# Patient Record
Sex: Female | Born: 1945 | Race: White | Hispanic: No | Marital: Single | Smoking: Former smoker
Health system: Southern US, Community
[De-identification: ages and names within clinical notes are randomized; demographics above are authoritative.]

## PROBLEM LIST (undated history)

## (undated) DIAGNOSIS — M810 Age-related osteoporosis without current pathological fracture: Secondary | ICD-10-CM

## (undated) DIAGNOSIS — F419 Anxiety disorder, unspecified: Secondary | ICD-10-CM

## (undated) DIAGNOSIS — J45909 Unspecified asthma, uncomplicated: Secondary | ICD-10-CM

## (undated) DIAGNOSIS — G709 Myoneural disorder, unspecified: Secondary | ICD-10-CM

## (undated) DIAGNOSIS — M1990 Unspecified osteoarthritis, unspecified site: Secondary | ICD-10-CM

## (undated) DIAGNOSIS — IMO0002 Reserved for concepts with insufficient information to code with codable children: Secondary | ICD-10-CM

## (undated) DIAGNOSIS — K219 Gastro-esophageal reflux disease without esophagitis: Secondary | ICD-10-CM

## (undated) DIAGNOSIS — I639 Cerebral infarction, unspecified: Secondary | ICD-10-CM

## (undated) DIAGNOSIS — I10 Essential (primary) hypertension: Secondary | ICD-10-CM

## (undated) DIAGNOSIS — J449 Chronic obstructive pulmonary disease, unspecified: Secondary | ICD-10-CM

## (undated) DIAGNOSIS — Z5189 Encounter for other specified aftercare: Secondary | ICD-10-CM

## (undated) HISTORY — PX: ABDOMINAL HYSTERECTOMY: SHX81

## (undated) HISTORY — DX: Gastro-esophageal reflux disease without esophagitis: K21.9

## (undated) HISTORY — DX: Chronic obstructive pulmonary disease, unspecified: J44.9

## (undated) HISTORY — DX: Age-related osteoporosis without current pathological fracture: M81.0

## (undated) HISTORY — DX: Encounter for other specified aftercare: Z51.89

## (undated) HISTORY — DX: Reserved for concepts with insufficient information to code with codable children: IMO0002

## (undated) HISTORY — DX: Cerebral infarction, unspecified: I63.9

## (undated) HISTORY — DX: Myoneural disorder, unspecified: G70.9

## (undated) HISTORY — PX: FRACTURE SURGERY: SHX138

## (undated) HISTORY — DX: Anxiety disorder, unspecified: F41.9

## (undated) HISTORY — DX: Unspecified asthma, uncomplicated: J45.909

## (undated) HISTORY — PX: TUBAL LIGATION: SHX77

## (undated) HISTORY — DX: Unspecified osteoarthritis, unspecified site: M19.90

## (undated) HISTORY — PX: EYE SURGERY: SHX253

---

## 2003-06-08 ENCOUNTER — Ambulatory Visit (HOSPITAL_COMMUNITY): Admission: RE | Admit: 2003-06-08 | Discharge: 2003-06-08 | Payer: Self-pay | Admitting: Internal Medicine

## 2004-04-17 ENCOUNTER — Ambulatory Visit: Payer: Self-pay | Admitting: Cardiology

## 2011-05-26 DIAGNOSIS — R011 Cardiac murmur, unspecified: Secondary | ICD-10-CM

## 2013-08-03 DIAGNOSIS — I10 Essential (primary) hypertension: Secondary | ICD-10-CM | POA: Insufficient documentation

## 2013-08-24 DIAGNOSIS — M5430 Sciatica, unspecified side: Secondary | ICD-10-CM | POA: Insufficient documentation

## 2013-08-24 DIAGNOSIS — M545 Low back pain, unspecified: Secondary | ICD-10-CM | POA: Insufficient documentation

## 2013-12-12 ENCOUNTER — Encounter (INDEPENDENT_AMBULATORY_CARE_PROVIDER_SITE_OTHER): Payer: Self-pay | Admitting: *Deleted

## 2014-03-29 DIAGNOSIS — H6990 Unspecified Eustachian tube disorder, unspecified ear: Secondary | ICD-10-CM | POA: Insufficient documentation

## 2014-05-18 ENCOUNTER — Ambulatory Visit (INDEPENDENT_AMBULATORY_CARE_PROVIDER_SITE_OTHER): Payer: Medicare PPO | Admitting: Otolaryngology

## 2014-05-18 DIAGNOSIS — H6983 Other specified disorders of Eustachian tube, bilateral: Secondary | ICD-10-CM

## 2014-08-16 DIAGNOSIS — H2513 Age-related nuclear cataract, bilateral: Secondary | ICD-10-CM | POA: Diagnosis not present

## 2014-08-16 DIAGNOSIS — E119 Type 2 diabetes mellitus without complications: Secondary | ICD-10-CM | POA: Diagnosis not present

## 2015-01-15 DIAGNOSIS — E7800 Pure hypercholesterolemia, unspecified: Secondary | ICD-10-CM | POA: Diagnosis not present

## 2015-01-15 DIAGNOSIS — E13319 Other specified diabetes mellitus with unspecified diabetic retinopathy without macular edema: Secondary | ICD-10-CM | POA: Diagnosis not present

## 2015-01-15 DIAGNOSIS — E161 Other hypoglycemia: Secondary | ICD-10-CM | POA: Diagnosis not present

## 2015-01-15 DIAGNOSIS — I10 Essential (primary) hypertension: Secondary | ICD-10-CM | POA: Diagnosis not present

## 2015-01-22 DIAGNOSIS — F064 Anxiety disorder due to known physiological condition: Secondary | ICD-10-CM | POA: Diagnosis not present

## 2015-01-22 DIAGNOSIS — I10 Essential (primary) hypertension: Secondary | ICD-10-CM | POA: Diagnosis not present

## 2015-01-22 DIAGNOSIS — Z23 Encounter for immunization: Secondary | ICD-10-CM | POA: Diagnosis not present

## 2015-01-22 DIAGNOSIS — M5431 Sciatica, right side: Secondary | ICD-10-CM | POA: Diagnosis not present

## 2015-01-22 DIAGNOSIS — Z13220 Encounter for screening for lipoid disorders: Secondary | ICD-10-CM | POA: Diagnosis not present

## 2015-01-22 DIAGNOSIS — R7301 Impaired fasting glucose: Secondary | ICD-10-CM | POA: Diagnosis not present

## 2015-03-30 DIAGNOSIS — J309 Allergic rhinitis, unspecified: Secondary | ICD-10-CM | POA: Insufficient documentation

## 2015-11-07 DIAGNOSIS — R7303 Prediabetes: Secondary | ICD-10-CM | POA: Insufficient documentation

## 2015-11-12 DIAGNOSIS — L80 Vitiligo: Secondary | ICD-10-CM | POA: Insufficient documentation

## 2015-11-12 DIAGNOSIS — E782 Mixed hyperlipidemia: Secondary | ICD-10-CM | POA: Insufficient documentation

## 2015-11-12 HISTORY — DX: Mixed hyperlipidemia: E78.2

## 2015-11-14 ENCOUNTER — Encounter (INDEPENDENT_AMBULATORY_CARE_PROVIDER_SITE_OTHER): Payer: Self-pay | Admitting: *Deleted

## 2016-08-11 ENCOUNTER — Encounter (INDEPENDENT_AMBULATORY_CARE_PROVIDER_SITE_OTHER): Payer: Self-pay | Admitting: *Deleted

## 2016-10-02 ENCOUNTER — Encounter (INDEPENDENT_AMBULATORY_CARE_PROVIDER_SITE_OTHER): Payer: Self-pay | Admitting: *Deleted

## 2016-10-02 ENCOUNTER — Encounter (INDEPENDENT_AMBULATORY_CARE_PROVIDER_SITE_OTHER): Payer: Self-pay

## 2016-10-03 ENCOUNTER — Other Ambulatory Visit (INDEPENDENT_AMBULATORY_CARE_PROVIDER_SITE_OTHER): Payer: Self-pay | Admitting: *Deleted

## 2016-10-03 DIAGNOSIS — Z1211 Encounter for screening for malignant neoplasm of colon: Secondary | ICD-10-CM

## 2016-10-03 DIAGNOSIS — Z86010 Personal history of colonic polyps: Secondary | ICD-10-CM

## 2017-01-16 ENCOUNTER — Encounter (INDEPENDENT_AMBULATORY_CARE_PROVIDER_SITE_OTHER): Payer: Self-pay | Admitting: *Deleted

## 2017-01-16 ENCOUNTER — Telehealth (INDEPENDENT_AMBULATORY_CARE_PROVIDER_SITE_OTHER): Payer: Self-pay | Admitting: *Deleted

## 2017-01-16 NOTE — Telephone Encounter (Signed)
Patient needs trilyte 

## 2017-01-19 MED ORDER — PEG 3350-KCL-NA BICARB-NACL 420 G PO SOLR: 4000.0000 mL | Freq: Once | ORAL | 0 refills | Status: AC

## 2017-01-20 ENCOUNTER — Telehealth (INDEPENDENT_AMBULATORY_CARE_PROVIDER_SITE_OTHER): Payer: Self-pay | Admitting: *Deleted

## 2017-01-20 NOTE — Telephone Encounter (Signed)
Referring MD/PCP: sasser   Procedure: tcs  Reason/Indication:  Screening, hx polyps  Has patient had this procedure before?  Yes, 2012 normal (scanned) -- TCS prior to 2012 had polyp  If so, when, by whom and where?    Is there a family history of colon cancer?  no  Who?  What age when diagnosed?    Is patient diabetic?   no      Does patient have prosthetic heart valve or mechanical valve?  no  Do you have a pacemaker?  no  Has patient ever had endocarditis? no  Has patient had joint replacement within last 12 months?  no  Is patient constipated or take laxatives? no  Does patient have a history of alcohol/drug use?  no  Is patient on Coumadin, Plavix and/or Aspirin? yes  Medications: asa 81 mg daily, lipitor 10 mg 1/2 tab every other day, hctz 12.5 mg daily, norvasc 10 mg daily  Allergies: nkda  Medication Adjustment per Dr Karilyn Cotaehman: asa 2 days  Procedure date & time: 02/19/17 at 830

## 2017-01-21 NOTE — Telephone Encounter (Signed)
agree

## 2017-02-19 ENCOUNTER — Encounter (HOSPITAL_COMMUNITY): Payer: Self-pay | Admitting: *Deleted

## 2017-02-19 ENCOUNTER — Encounter (HOSPITAL_COMMUNITY)
Admission: RE | Disposition: A | Discharge status: Home / Self Care | Payer: Self-pay | Source: Ambulatory Visit | Attending: Internal Medicine

## 2017-02-19 ENCOUNTER — Ambulatory Visit (HOSPITAL_COMMUNITY)
Admission: RE | Admit: 2017-02-19 | Discharge: 2017-02-19 | Disposition: A | Discharge status: Home / Self Care | Payer: Medicare Other | Source: Ambulatory Visit | Attending: Internal Medicine | Admitting: Internal Medicine

## 2017-02-19 DIAGNOSIS — K5730 Diverticulosis of large intestine without perforation or abscess without bleeding: Secondary | ICD-10-CM | POA: Insufficient documentation

## 2017-02-19 DIAGNOSIS — Z1211 Encounter for screening for malignant neoplasm of colon: Secondary | ICD-10-CM | POA: Insufficient documentation

## 2017-02-19 DIAGNOSIS — Q438 Other specified congenital malformations of intestine: Secondary | ICD-10-CM | POA: Diagnosis not present

## 2017-02-19 DIAGNOSIS — E785 Hyperlipidemia, unspecified: Secondary | ICD-10-CM | POA: Diagnosis not present

## 2017-02-19 DIAGNOSIS — Z7982 Long term (current) use of aspirin: Secondary | ICD-10-CM | POA: Diagnosis not present

## 2017-02-19 DIAGNOSIS — Z860100 Personal history of colon polyps, unspecified: Secondary | ICD-10-CM

## 2017-02-19 DIAGNOSIS — I10 Essential (primary) hypertension: Secondary | ICD-10-CM | POA: Insufficient documentation

## 2017-02-19 DIAGNOSIS — Z79899 Other long term (current) drug therapy: Secondary | ICD-10-CM | POA: Insufficient documentation

## 2017-02-19 DIAGNOSIS — Z09 Encounter for follow-up examination after completed treatment for conditions other than malignant neoplasm: Secondary | ICD-10-CM | POA: Diagnosis not present

## 2017-02-19 HISTORY — PX: COLONOSCOPY: SHX5424

## 2017-02-19 HISTORY — DX: Essential (primary) hypertension: I10

## 2017-02-19 SURGERY — COLONOSCOPY
Anesthesia: Moderate Sedation

## 2017-02-19 MED ORDER — STERILE WATER FOR IRRIGATION IR SOLN
Status: DC | PRN
  Administered 2017-02-19: 09:00:00

## 2017-02-19 MED ORDER — MIDAZOLAM HCL 5 MG/5ML IJ SOLN
INTRAMUSCULAR | Status: DC | PRN
  Administered 2017-02-19: 2 mg via INTRAVENOUS
  Administered 2017-02-19: 1 mg via INTRAVENOUS
  Administered 2017-02-19: 2 mg via INTRAVENOUS

## 2017-02-19 MED ORDER — MEPERIDINE HCL 50 MG/ML IJ SOLN
INTRAMUSCULAR | Status: AC
  Filled 2017-02-19: qty 1

## 2017-02-19 MED ORDER — SODIUM CHLORIDE 0.9 % IV SOLN
INTRAVENOUS | Status: DC
  Administered 2017-02-19: 08:00:00 via INTRAVENOUS

## 2017-02-19 MED ORDER — MEPERIDINE HCL 50 MG/ML IJ SOLN
INTRAMUSCULAR | Status: DC | PRN
  Administered 2017-02-19 (×2): 25 mg via INTRAVENOUS

## 2017-02-19 MED ORDER — MIDAZOLAM HCL 5 MG/5ML IJ SOLN
INTRAMUSCULAR | Status: AC
  Filled 2017-02-19: qty 10

## 2017-02-19 NOTE — Discharge Instructions (Signed)
Resume usual medications including aspirin as before. High fiber diet. No driving for 24 hours. Next colonoscopy in 7 years.   Colonoscopy, Adult, Care After This sheet gives you information about how to care for yourself after your procedure. Your doctor may also give you more specific instructions. If you have problems or questions, call your doctor. Follow these instructions at home: General instructions   For the first 24 hours after the procedure: ? Do not drive or use machinery. ? Do not sign important documents. ? Do not drink alcohol. ? Do your daily activities more slowly than normal. ? Eat foods that are soft and easy to digest. ? Rest often.  Take over-the-counter or prescription medicines only as told by your doctor.  It is up to you to get the results of your procedure. Ask your doctor, or the department performing the procedure, when your results will be ready. To help cramping and bloating:  Try walking around.  Put heat on your belly (abdomen) as told by your doctor. Use a heat source that your doctor recommends, such as a moist heat pack or a heating pad. ? Put a towel between your skin and the heat source. ? Leave the heat on for 20-30 minutes. ? Remove the heat if your skin turns bright red. This is especially important if you cannot feel pain, heat, or cold. You can get burned. Eating and drinking  Drink enough fluid to keep your pee (urine) clear or pale yellow.  Return to your normal diet as told by your doctor. Avoid heavy or fried foods that are hard to digest.  Avoid drinking alcohol for as long as told by your doctor. Contact a doctor if:  You have blood in your poop (stool) 2-3 days after the procedure. Get help right away if:  You have more than a small amount of blood in your poop.  You see large clumps of tissue (blood clots) in your poop.  Your belly is swollen.  You feel sick to your stomach (nauseous).  You throw up (vomit).  You  have a fever.  You have belly pain that gets worse, and medicine does not help your pain. This information is not intended to replace advice given to you by your health care provider. Make sure you discuss any questions you have with your health care provider.    High-Fiber Diet Fiber, also called dietary fiber, is a type of carbohydrate found in fruits, vegetables, whole grains, and beans. A high-fiber diet can have many health benefits. Your health care provider may recommend a high-fiber diet to help:  Prevent constipation. Fiber can make your bowel movements more regular.  Lower your cholesterol.  Relieve hemorrhoids, uncomplicated diverticulosis, or irritable bowel syndrome.  Prevent overeating as part of a weight-loss plan.  Prevent heart disease, type 2 diabetes, and certain cancers.  What is my plan? The recommended daily intake of fiber includes:  38 grams for men under age 69.  30 grams for men over age 69.  25 grams for women under age 69.  21 grams for women over age 69.  You can get the recommended daily intake of dietary fiber by eating a variety of fruits, vegetables, grains, and beans. Your health care provider may also recommend a fiber supplement if it is not possible to get enough fiber through your diet. What do I need to know about a high-fiber diet?  Fiber supplements have not been widely studied for their effectiveness, so it is better  to get fiber through food sources.  Always check the fiber content on thenutrition facts label of any prepackaged food. Look for foods that contain at least 5 grams of fiber per serving.  Ask your dietitian if you have questions about specific foods that are related to your condition, especially if those foods are not listed in the following section.  Increase your daily fiber consumption gradually. Increasing your intake of dietary fiber too quickly may cause bloating, cramping, or gas.  Drink plenty of water. Water  helps you to digest fiber. What foods can I eat? Grains Whole-grain breads. Multigrain cereal. Oats and oatmeal. Brown rice. Barley. Bulgur wheat. Millet. Bran muffins. Popcorn. Rye wafer crackers. Vegetables Sweet potatoes. Spinach. Kale. Artichokes. Cabbage. Broccoli. Green peas. Carrots. Squash. Fruits Berries. Pears. Apples. Oranges. Avocados. Prunes and raisins. Dried figs. Meats and Other Protein Sources Navy, kidney, pinto, and soy beans. Split peas. Lentils. Nuts and seeds. Dairy Fiber-fortified yogurt. Beverages Fiber-fortified soy milk. Fiber-fortified orange juice. Other Fiber bars. The items listed above may not be a complete list of recommended foods or beverages. Contact your dietitian for more options. What foods are not recommended? Grains White bread. Pasta made with refined flour. White rice. Vegetables Fried potatoes. Canned vegetables. Well-cooked vegetables. Fruits Fruit juice. Cooked, strained fruit. Meats and Other Protein Sources Fatty cuts of meat. Fried Environmental education officerpoultry or fried fish. Dairy Milk. Yogurt. Cream cheese. Sour cream. Beverages Soft drinks. Other Cakes and pastries. Butter and oils. The items listed above may not be a complete list of foods and beverages to avoid. Contact your dietitian for more information. What are some tips for including high-fiber foods in my diet?  Eat a wide variety of high-fiber foods.  Make sure that half of all grains consumed each day are whole grains.  Replace breads and cereals made from refined flour or white flour with whole-grain breads and cereals.  Replace white rice with brown rice, bulgur wheat, or millet.  Start the day with a breakfast that is high in fiber, such as a cereal that contains at least 5 grams of fiber per serving.  Use beans in place of meat in soups, salads, or pasta.  Eat high-fiber snacks, such as berries, raw vegetables, nuts, or popcorn. This information is not intended to replace  advice given to you by your health care provider. Make sure you discuss any questions you have with your health care provider. Document Released: 02/24/2005 Document Revised: 08/02/2015 Document Reviewed: 08/09/2013 Elsevier Interactive Patient Education  2017 [employer]

## 2017-02-19 NOTE — Op Note (Signed)
[facility] Patient Name: Robin Bridges Procedure Date: 02/19/2017 8:09 AM MRN: [id number] Date of Birth: 1945/04/12 Attending MD: Lionel December , MD CSN: [id number] Age: 71 Admit Type: Outpatient Procedure:                Colonoscopy Indications:              High risk colon cancer surveillance: Personal                            history of colonic polyps Providers:                Lionel December, MD, Loma Messing B. Patsy Lager, RN, Edythe Clarity, Technician, Burke Keels, Technician Referring MD:             Estanislado Pandy, MD Medicines:                Meperidine 50 mg IV, Midazolam 5 mg IV Complications:            No immediate complications. Estimated Blood Loss:     Estimated blood loss: none. Procedure:                Pre-Anesthesia Assessment:                           - Prior to the procedure, a History and Physical                            was performed, and patient medications and                            allergies were reviewed. The patient's tolerance of                            previous anesthesia was also reviewed. The risks                            and benefits of the procedure and the sedation                            options and risks were discussed with the patient.                            All questions were answered, and informed consent                            was obtained. Prior Anticoagulants: The patient                            last took aspirin 4 days prior to the procedure.                            ASA Grade Assessment: I - A normal, healthy  patient. After reviewing the risks and benefits,                            the patient was deemed in satisfactory condition to                            undergo the procedure.                           After obtaining informed consent, the colonoscope                            was passed under direct vision. Throughout the   procedure, the patient's blood pressure, pulse, and                            oxygen saturations were monitored continuously. The                            [id number] ([id number] scope was introduced through                            the anus and advanced to the the cecum, identified                            by appendiceal orifice and ileocecal valve. The                            colonoscopy was technically difficult and complex                            due to significant looping. Successful completion                            of the procedure was aided by changing the patient                            to a supine position, using manual pressure and                            withdrawing and reinserting the scope. The patient                            tolerated the procedure well. The quality of the                            bowel preparation was excellent. The ileocecal                            valve, appendiceal orifice, and rectum were                            photographed. Scope In: 8:37:14 AM Scope Out: 9:02:23 AM Scope Withdrawal Time: 0 hours 9 minutes 51 seconds  Total Procedure Duration: 0 hours 25 minutes 9 seconds  Findings:      The perianal and digital rectal examinations were normal.      A few medium-mouthed diverticula were found in the ascending colon.      Scattered small and medium diverticula were found in the sigmoid colon.      The retroflexed view of the distal rectum and anal verge was normal and       showed no anal or rectal abnormalities. Impression:               - Diverticulosis in the ascending colon.                           - Diverticulosis in the sigmoid colon.                           - No specimens collected. Moderate Sedation:      Moderate (conscious) sedation was administered by the endoscopy nurse       and supervised by the endoscopist. The following parameters were       monitored: oxygen saturation, heart rate, blood pressure,  CO2       capnography and response to care. Total physician intraservice time was       31 minutes. Recommendation:           - Patient has a contact number available for                            emergencies. The signs and symptoms of potential                            delayed complications were discussed with the                            patient. Return to normal activities tomorrow.                            Written discharge instructions were provided to the                            patient.                           - High fiber diet today.                           - Continue present medications.                           - Repeat colonoscopy in 7 years for surveillance. Procedure Code(s):        --- Professional ---                           [id number], Colonoscopy, flexible; diagnostic, including                            collection of specimen(s) by brushing or washing,  when performed (separate procedure)                           99152, Moderate sedation services provided by the                            same physician or other qualified health care                            professional performing the diagnostic or                            therapeutic service that the sedation supports,                            requiring the presence of an independent trained                            observer to assist in the monitoring of the                            patient's level of consciousness and physiological                            status; initial 15 minutes of intraservice time,                            patient age 45 years or older                           [id number], Moderate sedation services; each additional                            15 minutes intraservice time Diagnosis Code(s):        --- Professional ---                           Z86.010, Personal history of colonic polyps                           K57.30, Diverticulosis of large  intestine without                            perforation or abscess without bleeding CPT copyright 2016 American Medical Association. All rights reserved. The codes documented in this report are preliminary and upon coder review may  be revised to meet current compliance requirements. Lionel December, MD Lionel December, MD 02/19/2017 9:10:54 AM This report has been signed electronically. Number of Addenda: 0

## 2017-02-19 NOTE — H&P (Signed)
Robin Bridges is an 71 y.o. female.   Chief Complaint: Patient is here for colonoscopy. HPI: Patient is 71 year old [place] who is here for surveillance colonoscopy.  She has a history of colonic adenomas.  She denies abdominal pain change in bowel habits or rectal bleeding. Family history is negative for CRC.  Past Medical History:  Diagnosis Date  . Hypertension       Hyperlipidemia.   No family history on file. Social History:  has no tobacco, alcohol, and drug history on file.  Allergies: No Known Allergies  Medications Prior to Admission  Medication Sig Dispense Refill  . amLODipine (NORVASC) 10 MG tablet Take 10 mg by mouth daily.    Marland Kitchen. aspirin EC 81 MG tablet Take 81 mg by mouth daily.    Marland Kitchen. atorvastatin (LIPITOR) 10 MG tablet Take 5 mg by mouth daily.    . hydrochlorothiazide (MICROZIDE) 12.5 MG capsule Take 12.5 mg by mouth daily.  3    No results found for this or any previous visit (from the past 48 hour(s)). No results found.  ROS  Blood pressure 134/87, pulse 62, temperature 97.9 F (36.6 C), temperature source Oral, resp. rate 20, height 4\' 11"  (1.499 m), SpO2 100 %. Physical Exam  Constitutional: She appears well-developed and well-nourished.  HENT:  Mouth/Throat: Oropharynx is clear and moist.  Eyes: Conjunctivae are normal. No scleral icterus.  Neck: No thyromegaly present.  Cardiovascular: Normal rate, regular rhythm and normal heart sounds.  No murmur heard. Respiratory: Effort normal and breath sounds normal.  GI: Soft. She exhibits no distension and no mass. There is no tenderness.  Musculoskeletal: She exhibits no edema.  Lymphadenopathy:    She has no cervical adenopathy.  Neurological: She is alert.  Skin: Skin is warm and dry.     Assessment/Plan History of colonic adenomas. Surveillance colonoscopy.  Lionel DecemberNajeeb Rehman, MD 02/19/2017, 8:27 AM

## 2017-02-23 ENCOUNTER — Encounter (HOSPITAL_COMMUNITY): Payer: Self-pay | Admitting: Internal Medicine

## 2019-03-31 DIAGNOSIS — H35462 Secondary vitreoretinal degeneration, left eye: Secondary | ICD-10-CM | POA: Diagnosis not present

## 2019-03-31 DIAGNOSIS — H43811 Vitreous degeneration, right eye: Secondary | ICD-10-CM | POA: Diagnosis not present

## 2019-03-31 DIAGNOSIS — H5203 Hypermetropia, bilateral: Secondary | ICD-10-CM | POA: Diagnosis not present

## 2019-03-31 DIAGNOSIS — H11133 Conjunctival pigmentations, bilateral: Secondary | ICD-10-CM | POA: Diagnosis not present

## 2019-03-31 DIAGNOSIS — H18413 Arcus senilis, bilateral: Secondary | ICD-10-CM | POA: Diagnosis not present

## 2019-03-31 DIAGNOSIS — H3589 Other specified retinal disorders: Secondary | ICD-10-CM | POA: Diagnosis not present

## 2019-03-31 DIAGNOSIS — H52223 Regular astigmatism, bilateral: Secondary | ICD-10-CM | POA: Diagnosis not present

## 2019-03-31 DIAGNOSIS — H35461 Secondary vitreoretinal degeneration, right eye: Secondary | ICD-10-CM | POA: Diagnosis not present

## 2019-03-31 DIAGNOSIS — H524 Presbyopia: Secondary | ICD-10-CM | POA: Diagnosis not present

## 2019-04-17 DIAGNOSIS — S82831A Other fracture of upper and lower end of right fibula, initial encounter for closed fracture: Secondary | ICD-10-CM | POA: Diagnosis not present

## 2019-04-17 DIAGNOSIS — I10 Essential (primary) hypertension: Secondary | ICD-10-CM | POA: Diagnosis not present

## 2019-04-17 DIAGNOSIS — Z8673 Personal history of transient ischemic attack (TIA), and cerebral infarction without residual deficits: Secondary | ICD-10-CM | POA: Diagnosis not present

## 2019-04-17 DIAGNOSIS — E119 Type 2 diabetes mellitus without complications: Secondary | ICD-10-CM | POA: Diagnosis not present

## 2019-04-17 DIAGNOSIS — Z79899 Other long term (current) drug therapy: Secondary | ICD-10-CM | POA: Diagnosis not present

## 2019-04-17 DIAGNOSIS — F17200 Nicotine dependence, unspecified, uncomplicated: Secondary | ICD-10-CM | POA: Diagnosis not present

## 2019-04-17 DIAGNOSIS — S82431A Displaced oblique fracture of shaft of right fibula, initial encounter for closed fracture: Secondary | ICD-10-CM | POA: Diagnosis not present

## 2019-04-17 DIAGNOSIS — Z7982 Long term (current) use of aspirin: Secondary | ICD-10-CM | POA: Diagnosis not present

## 2019-04-17 DIAGNOSIS — Z823 Family history of stroke: Secondary | ICD-10-CM | POA: Diagnosis not present

## 2019-04-17 DIAGNOSIS — M1990 Unspecified osteoarthritis, unspecified site: Secondary | ICD-10-CM | POA: Diagnosis not present

## 2019-04-17 DIAGNOSIS — W010XXA Fall on same level from slipping, tripping and stumbling without subsequent striking against object, initial encounter: Secondary | ICD-10-CM | POA: Diagnosis not present

## 2019-04-19 DIAGNOSIS — S8261XA Displaced fracture of lateral malleolus of right fibula, initial encounter for closed fracture: Secondary | ICD-10-CM | POA: Diagnosis not present

## 2019-04-19 DIAGNOSIS — Z01818 Encounter for other preprocedural examination: Secondary | ICD-10-CM | POA: Diagnosis not present

## 2019-04-19 DIAGNOSIS — S93431A Sprain of tibiofibular ligament of right ankle, initial encounter: Secondary | ICD-10-CM | POA: Diagnosis not present

## 2019-04-21 DIAGNOSIS — S8261XA Displaced fracture of lateral malleolus of right fibula, initial encounter for closed fracture: Secondary | ICD-10-CM | POA: Diagnosis not present

## 2019-04-21 DIAGNOSIS — S82831D Other fracture of upper and lower end of right fibula, subsequent encounter for closed fracture with routine healing: Secondary | ICD-10-CM | POA: Diagnosis not present

## 2019-04-21 DIAGNOSIS — E7800 Pure hypercholesterolemia, unspecified: Secondary | ICD-10-CM | POA: Diagnosis not present

## 2019-04-21 DIAGNOSIS — G8918 Other acute postprocedural pain: Secondary | ICD-10-CM | POA: Diagnosis not present

## 2019-04-21 DIAGNOSIS — E785 Hyperlipidemia, unspecified: Secondary | ICD-10-CM | POA: Diagnosis not present

## 2019-04-21 DIAGNOSIS — Z79899 Other long term (current) drug therapy: Secondary | ICD-10-CM | POA: Diagnosis not present

## 2019-04-21 DIAGNOSIS — I10 Essential (primary) hypertension: Secondary | ICD-10-CM | POA: Diagnosis not present

## 2019-04-21 DIAGNOSIS — F17210 Nicotine dependence, cigarettes, uncomplicated: Secondary | ICD-10-CM | POA: Diagnosis not present

## 2019-05-02 DIAGNOSIS — S93431D Sprain of tibiofibular ligament of right ankle, subsequent encounter: Secondary | ICD-10-CM | POA: Diagnosis not present

## 2019-05-02 DIAGNOSIS — S8261XA Displaced fracture of lateral malleolus of right fibula, initial encounter for closed fracture: Secondary | ICD-10-CM | POA: Diagnosis not present

## 2019-05-02 DIAGNOSIS — S8261XD Displaced fracture of lateral malleolus of right fibula, subsequent encounter for closed fracture with routine healing: Secondary | ICD-10-CM | POA: Diagnosis not present

## 2019-05-18 DIAGNOSIS — S8261XD Displaced fracture of lateral malleolus of right fibula, subsequent encounter for closed fracture with routine healing: Secondary | ICD-10-CM | POA: Diagnosis not present

## 2019-06-20 DIAGNOSIS — S8261XD Displaced fracture of lateral malleolus of right fibula, subsequent encounter for closed fracture with routine healing: Secondary | ICD-10-CM | POA: Diagnosis not present

## 2019-06-27 DIAGNOSIS — E782 Mixed hyperlipidemia: Secondary | ICD-10-CM | POA: Diagnosis not present

## 2019-06-27 DIAGNOSIS — I10 Essential (primary) hypertension: Secondary | ICD-10-CM | POA: Diagnosis not present

## 2019-06-27 DIAGNOSIS — R5383 Other fatigue: Secondary | ICD-10-CM | POA: Diagnosis not present

## 2019-06-27 DIAGNOSIS — E119 Type 2 diabetes mellitus without complications: Secondary | ICD-10-CM | POA: Diagnosis not present

## 2019-06-29 DIAGNOSIS — F419 Anxiety disorder, unspecified: Secondary | ICD-10-CM | POA: Diagnosis not present

## 2019-06-29 DIAGNOSIS — M1990 Unspecified osteoarthritis, unspecified site: Secondary | ICD-10-CM | POA: Diagnosis not present

## 2019-06-29 DIAGNOSIS — I739 Peripheral vascular disease, unspecified: Secondary | ICD-10-CM | POA: Diagnosis not present

## 2019-06-29 DIAGNOSIS — E669 Obesity, unspecified: Secondary | ICD-10-CM | POA: Diagnosis not present

## 2019-06-29 DIAGNOSIS — I10 Essential (primary) hypertension: Secondary | ICD-10-CM | POA: Diagnosis not present

## 2019-06-29 DIAGNOSIS — Z720 Tobacco use: Secondary | ICD-10-CM | POA: Diagnosis not present

## 2019-06-29 DIAGNOSIS — Z6830 Body mass index (BMI) 30.0-30.9, adult: Secondary | ICD-10-CM | POA: Diagnosis not present

## 2019-06-29 DIAGNOSIS — E785 Hyperlipidemia, unspecified: Secondary | ICD-10-CM | POA: Diagnosis not present

## 2019-06-29 DIAGNOSIS — Z823 Family history of stroke: Secondary | ICD-10-CM | POA: Diagnosis not present

## 2019-06-30 DIAGNOSIS — Z6830 Body mass index (BMI) 30.0-30.9, adult: Secondary | ICD-10-CM | POA: Diagnosis not present

## 2019-06-30 DIAGNOSIS — I10 Essential (primary) hypertension: Secondary | ICD-10-CM | POA: Diagnosis not present

## 2019-06-30 DIAGNOSIS — R232 Flushing: Secondary | ICD-10-CM | POA: Diagnosis not present

## 2019-06-30 DIAGNOSIS — F064 Anxiety disorder due to known physiological condition: Secondary | ICD-10-CM | POA: Diagnosis not present

## 2019-06-30 DIAGNOSIS — R7301 Impaired fasting glucose: Secondary | ICD-10-CM | POA: Diagnosis not present

## 2019-06-30 DIAGNOSIS — Z1331 Encounter for screening for depression: Secondary | ICD-10-CM | POA: Diagnosis not present

## 2019-06-30 DIAGNOSIS — Z1389 Encounter for screening for other disorder: Secondary | ICD-10-CM | POA: Diagnosis not present

## 2019-07-20 DIAGNOSIS — S82851A Displaced trimalleolar fracture of right lower leg, initial encounter for closed fracture: Secondary | ICD-10-CM | POA: Insufficient documentation

## 2019-07-20 DIAGNOSIS — S8261XD Displaced fracture of lateral malleolus of right fibula, subsequent encounter for closed fracture with routine healing: Secondary | ICD-10-CM | POA: Diagnosis not present

## 2019-12-27 DIAGNOSIS — E7800 Pure hypercholesterolemia, unspecified: Secondary | ICD-10-CM | POA: Diagnosis not present

## 2019-12-27 DIAGNOSIS — R7301 Impaired fasting glucose: Secondary | ICD-10-CM | POA: Diagnosis not present

## 2019-12-27 DIAGNOSIS — E782 Mixed hyperlipidemia: Secondary | ICD-10-CM | POA: Diagnosis not present

## 2019-12-27 DIAGNOSIS — E119 Type 2 diabetes mellitus without complications: Secondary | ICD-10-CM | POA: Diagnosis not present

## 2019-12-27 DIAGNOSIS — I10 Essential (primary) hypertension: Secondary | ICD-10-CM | POA: Diagnosis not present

## 2019-12-30 DIAGNOSIS — R7301 Impaired fasting glucose: Secondary | ICD-10-CM | POA: Diagnosis not present

## 2019-12-30 DIAGNOSIS — F064 Anxiety disorder due to known physiological condition: Secondary | ICD-10-CM | POA: Diagnosis not present

## 2019-12-30 DIAGNOSIS — R232 Flushing: Secondary | ICD-10-CM | POA: Diagnosis not present

## 2019-12-30 DIAGNOSIS — Z6829 Body mass index (BMI) 29.0-29.9, adult: Secondary | ICD-10-CM | POA: Diagnosis not present

## 2019-12-30 DIAGNOSIS — E7801 Familial hypercholesterolemia: Secondary | ICD-10-CM | POA: Diagnosis not present

## 2019-12-30 DIAGNOSIS — I10 Essential (primary) hypertension: Secondary | ICD-10-CM | POA: Diagnosis not present

## 2019-12-30 DIAGNOSIS — Z20828 Contact with and (suspected) exposure to other viral communicable diseases: Secondary | ICD-10-CM | POA: Diagnosis not present

## 2019-12-30 DIAGNOSIS — R739 Hyperglycemia, unspecified: Secondary | ICD-10-CM | POA: Diagnosis not present

## 2020-01-18 DIAGNOSIS — J069 Acute upper respiratory infection, unspecified: Secondary | ICD-10-CM | POA: Diagnosis not present

## 2020-01-18 DIAGNOSIS — Z20828 Contact with and (suspected) exposure to other viral communicable diseases: Secondary | ICD-10-CM | POA: Diagnosis not present

## 2020-01-20 DIAGNOSIS — I10 Essential (primary) hypertension: Secondary | ICD-10-CM | POA: Diagnosis not present

## 2020-01-20 DIAGNOSIS — R739 Hyperglycemia, unspecified: Secondary | ICD-10-CM | POA: Diagnosis not present

## 2020-01-20 DIAGNOSIS — R7301 Impaired fasting glucose: Secondary | ICD-10-CM | POA: Diagnosis not present

## 2020-01-20 DIAGNOSIS — R232 Flushing: Secondary | ICD-10-CM | POA: Diagnosis not present

## 2020-01-20 DIAGNOSIS — R059 Cough, unspecified: Secondary | ICD-10-CM | POA: Diagnosis not present

## 2020-01-20 DIAGNOSIS — Z0001 Encounter for general adult medical examination with abnormal findings: Secondary | ICD-10-CM | POA: Diagnosis not present

## 2020-01-20 DIAGNOSIS — F064 Anxiety disorder due to known physiological condition: Secondary | ICD-10-CM | POA: Diagnosis not present

## 2020-01-20 DIAGNOSIS — E7800 Pure hypercholesterolemia, unspecified: Secondary | ICD-10-CM | POA: Diagnosis not present

## 2020-02-14 DIAGNOSIS — H5710 Ocular pain, unspecified eye: Secondary | ICD-10-CM | POA: Diagnosis not present

## 2020-02-14 DIAGNOSIS — K2970 Gastritis, unspecified, without bleeding: Secondary | ICD-10-CM | POA: Diagnosis not present

## 2020-03-05 DIAGNOSIS — R109 Unspecified abdominal pain: Secondary | ICD-10-CM | POA: Diagnosis not present

## 2020-03-05 DIAGNOSIS — R319 Hematuria, unspecified: Secondary | ICD-10-CM | POA: Diagnosis not present

## 2020-03-16 DIAGNOSIS — N281 Cyst of kidney, acquired: Secondary | ICD-10-CM | POA: Diagnosis not present

## 2020-03-29 DIAGNOSIS — N281 Cyst of kidney, acquired: Secondary | ICD-10-CM | POA: Diagnosis not present

## 2020-03-29 DIAGNOSIS — K5790 Diverticulosis of intestine, part unspecified, without perforation or abscess without bleeding: Secondary | ICD-10-CM | POA: Diagnosis not present

## 2020-04-23 ENCOUNTER — Telehealth: Payer: Self-pay

## 2020-04-23 NOTE — Telephone Encounter (Signed)
Pt called asking to be seen earlier than Mar. 15th as a new pt. I apologized and explained that was earliest we had. She complained of blood pressure going up and down and abdomen swelling. I explained she needed to see Pcp until then.

## 2020-05-15 DIAGNOSIS — Z1231 Encounter for screening mammogram for malignant neoplasm of breast: Secondary | ICD-10-CM | POA: Diagnosis not present

## 2020-05-21 NOTE — Consult Note (Signed)
H&P  Chief Complaint: Kidney cysts  History of Present Illness: Robin Bridges is a 75 y.o. year old female referred by Dr. Fara Chute for evaluation and management of renal cyst.  She had fairly significant abdominal pain in December 2021, going into January.  She eventually had an ultrasound of the kidneys/abdomen which revealed bilateral renal cysts with some worrisome features.  She had follow-up MRI of the abdomen February 20 which revealed Bosniak category 1 and 2 cysts.  Her pain for the most part has resolved.  She is worried that the cyst may be causing her pain.  She does have a history of diverticulitis.  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  She also has overactive bladder symptoms.  Past Medical History:  Diagnosis Date  . Hypertension       Home Medications:  (Not in a hospital admission)   Allergies: No Known Allergies  No family history on file.  Social History:  has no history on file for tobacco use, alcohol use, and drug use.  ROS: A complete review of systems was performed.  All systems are negative except for pertinent findings as noted.  Physical Exam:  Vital signs in last 24 hours: @VSRANGES @ General:  Alert and oriented, No acute distress HEENT: Normocephalic, atraumatic Neck: No JVD or lymphadenopathy Cardiovascular: Regular rate  Lungs: Normal inspiratory/expiratory excursion Abdomen: Soft, nontender, nondistended, no abdominal masses.   Back: No CVA tenderness Extremities: No edema Neurologic: Grossly intact  I have reviewed prior pt notes  I have reviewed notes from referring/previous physicians  I have reviewed urinalysis results  I have independently reviewed prior imaging-I reviewed personally images of the MRI and ultrasound, she was also showed her MRI images    Radiologic Imaging:--MRI reading 15 mm well-defined homogeneous T1 hypointense, T2 hyperintense lesion identified upper pole right  kidney. This lesion contains a thin internal septation without perceptible enhancement, compatible with Bosniak II cyst. 5.5 cm simple cyst identified upper pole left kidney with thin smooth incomplete inferior septation. 3.0 cm simple cyst anterior interpolar left kidney also with thin smooth incomplete posterior septation.. 3.4 cm simple cyst noted lower pole left kidney.Tiny subcapsular T2 hyperintensities in both kidneys are too small to characterize but compatible with benign etiology.  Stomach/Bowel: Stomach is unremarkable. No gastric wall thickening. No evidence of outlet obstruction. Duodenum is normally positioned as is the ligament of Treitz. No small bowel or colonic dilatation within the visualized abdomen. Diverticular changes noted diffusely along the colon.  Vascular/Lymphatic: No abdominal aortic aneurysm. No abdominal lymphadenopathy.  Other: No intraperitoneal free fluid.  Musculoskeletal: No focal suspicious marrow enhancement within the visualized bony anatomy.  IMPRESSION: 1. Benign Bosniak I and II cysts in the kidneys bilaterally. 2. Additional very tiny subcapsular T2 hyperintensities in each kidney, too small to characterize but statistically are most likely cysts   Impression/Assessment:  1.  Overactive bladder, bothersome  2.  Longstanding history of microscopic hematuria, I do think we need to evaluate this  3.  Bilateral renal cysts, category 1 and 2  Plan:  1.  I reassured the patient that I do not think her abdominal pain is caused by the cyst which more than likely have been there for many years  2.  At this point I do not think we need to intervene, but I will see her back in about 3 months.  If she has persistent pain that I think is related, we may consider interventional  radiology to drain these percutaneously  3.  I gave her an overactive bladder guide sheet.  Bertram Millard Vassie Kugel 05/21/2020, 8:55 PM  Bertram Millard. Robin Cataldo MD

## 2020-05-22 ENCOUNTER — Encounter: Payer: Self-pay | Admitting: Urology

## 2020-05-22 ENCOUNTER — Ambulatory Visit (INDEPENDENT_AMBULATORY_CARE_PROVIDER_SITE_OTHER): Payer: Medicare PPO | Admitting: Urology

## 2020-05-22 ENCOUNTER — Other Ambulatory Visit: Payer: Self-pay

## 2020-05-22 VITALS — BP 134/83 | HR 79 | Temp 97.9°F

## 2020-05-22 DIAGNOSIS — N3281 Overactive bladder: Secondary | ICD-10-CM | POA: Diagnosis not present

## 2020-05-22 DIAGNOSIS — R3129 Other microscopic hematuria: Secondary | ICD-10-CM

## 2020-05-22 DIAGNOSIS — N281 Cyst of kidney, acquired: Secondary | ICD-10-CM

## 2020-05-22 LAB — URINALYSIS, ROUTINE W REFLEX MICROSCOPIC
Bilirubin, UA: NEGATIVE
Glucose, UA: NEGATIVE
Ketones, UA: NEGATIVE
Leukocytes,UA: NEGATIVE
Nitrite, UA: NEGATIVE
Protein,UA: NEGATIVE
Specific Gravity, UA: 1.025 (ref 1.005–1.030)
Urobilinogen, Ur: 0.2 mg/dL (ref 0.2–1.0)
pH, UA: 5.5 (ref 5.0–7.5)

## 2020-05-22 LAB — MICROSCOPIC EXAMINATION
Epithelial Cells (non renal): >10 /hpf — AB (ref 0–10)
Renal Epithel, UA: NONE SEEN /hpf

## 2020-05-22 NOTE — Progress Notes (Signed)
Urological Symptom Review  Patient is experiencing the following symptoms: Frequent urination Hard to postpone urination Get up at night to urinate Leakage of urine Stream starts and stops Trouble starting stream Have to strain to urinate Blood in urine   Review of Systems  Gastrointestinal (upper)  : Indigestion/heartburn  Gastrointestinal (lower) : Negative for lower GI symptoms  Constitutional : Negative for symptoms  Skin: Negative for skin symptoms  Eyes: Negative for eye symptoms  Ear/Nose/Throat : Sinus problems  Hematologic/Lymphatic: Negative for Hematologic/Lymphatic symptoms  Cardiovascular : Negative for cardiovascular symptoms  Respiratory : Negative for respiratory symptoms  Endocrine: Excessive thirst  Musculoskeletal: Back pain Joint pain  Neurological: Negative for neurological symptoms  Psychologic: Depression Anxiety

## 2020-07-27 DIAGNOSIS — E7849 Other hyperlipidemia: Secondary | ICD-10-CM | POA: Diagnosis not present

## 2020-07-27 DIAGNOSIS — E161 Other hypoglycemia: Secondary | ICD-10-CM | POA: Diagnosis not present

## 2020-07-27 DIAGNOSIS — E782 Mixed hyperlipidemia: Secondary | ICD-10-CM | POA: Diagnosis not present

## 2020-07-27 DIAGNOSIS — R5383 Other fatigue: Secondary | ICD-10-CM | POA: Diagnosis not present

## 2020-07-27 DIAGNOSIS — E119 Type 2 diabetes mellitus without complications: Secondary | ICD-10-CM | POA: Diagnosis not present

## 2020-07-30 DIAGNOSIS — R232 Flushing: Secondary | ICD-10-CM | POA: Diagnosis not present

## 2020-07-30 DIAGNOSIS — E1122 Type 2 diabetes mellitus with diabetic chronic kidney disease: Secondary | ICD-10-CM | POA: Diagnosis not present

## 2020-07-30 DIAGNOSIS — Z23 Encounter for immunization: Secondary | ICD-10-CM | POA: Diagnosis not present

## 2020-07-30 DIAGNOSIS — I10 Essential (primary) hypertension: Secondary | ICD-10-CM | POA: Diagnosis not present

## 2020-07-30 DIAGNOSIS — Z0001 Encounter for general adult medical examination with abnormal findings: Secondary | ICD-10-CM | POA: Diagnosis not present

## 2020-07-30 DIAGNOSIS — Z1389 Encounter for screening for other disorder: Secondary | ICD-10-CM | POA: Diagnosis not present

## 2020-07-30 DIAGNOSIS — E7801 Familial hypercholesterolemia: Secondary | ICD-10-CM | POA: Diagnosis not present

## 2020-07-30 DIAGNOSIS — Z1331 Encounter for screening for depression: Secondary | ICD-10-CM | POA: Diagnosis not present

## 2020-08-16 ENCOUNTER — Ambulatory Visit: Payer: Medicare PPO

## 2020-08-27 ENCOUNTER — Encounter: Payer: Self-pay | Admitting: Internal Medicine

## 2020-08-27 ENCOUNTER — Other Ambulatory Visit: Payer: Self-pay

## 2020-08-27 ENCOUNTER — Ambulatory Visit: Payer: Medicare PPO | Admitting: Internal Medicine

## 2020-08-27 VITALS — BP 149/81 | HR 78 | Temp 98.3°F | Resp 16 | Ht 59.0 in | Wt 151.0 lb

## 2020-08-27 DIAGNOSIS — F419 Anxiety disorder, unspecified: Secondary | ICD-10-CM

## 2020-08-27 DIAGNOSIS — K219 Gastro-esophageal reflux disease without esophagitis: Secondary | ICD-10-CM | POA: Diagnosis not present

## 2020-08-27 DIAGNOSIS — J309 Allergic rhinitis, unspecified: Secondary | ICD-10-CM

## 2020-08-27 DIAGNOSIS — E782 Mixed hyperlipidemia: Secondary | ICD-10-CM

## 2020-08-27 DIAGNOSIS — L918 Other hypertrophic disorders of the skin: Secondary | ICD-10-CM

## 2020-08-27 DIAGNOSIS — I10 Essential (primary) hypertension: Secondary | ICD-10-CM

## 2020-08-27 DIAGNOSIS — Z7689 Persons encountering health services in other specified circumstances: Secondary | ICD-10-CM | POA: Diagnosis not present

## 2020-08-27 DIAGNOSIS — R3121 Asymptomatic microscopic hematuria: Secondary | ICD-10-CM | POA: Diagnosis not present

## 2020-08-27 DIAGNOSIS — R7303 Prediabetes: Secondary | ICD-10-CM

## 2020-08-27 NOTE — Assessment & Plan Note (Signed)
On Atorvastatin 

## 2020-08-27 NOTE — Progress Notes (Addendum)
New Patient Office Visit  Subjective:  Patient ID: Robin Bridges, female    DOB: July 03, 1945  Age: 75 y.o. MRN: [id number]  CC:  Chief Complaint  Patient presents with   New Patient (Initial Visit)    New patient has been having some trouble when she coughs her right eye hurts sometimes it will go away sometimes it lingers as in Friday night it hurt all night also has skin tags    HPI Robin Bridges is a 75 year old female with PMH of HTN, HLD, prediabetes, GERD, anxiety and tobacco abuse who presents for establishing care.  Her BP was elevated in the office today. She appears to be stressed today. She denies any headache, dizziness, chest pain, dyspnea or palpitations.  She reports pain behind right eye especially when she coughs, which has been going on for about 8 months. Denies any fever, chills, sore throat, dyspnea or wheezing. Takes Zyrtec for allergies and uses Flonase PRN.  She takes Effexor and PRN Xanax for anxiety.  She reports having skin tag over back of her neck area. Has been mildly painful lately. States that she had another skin tag over neck area, which fell off.  She is up-to-date with COVID vaccine.  Past Medical History:  Diagnosis Date   Hypertension     Past Surgical History:  Procedure Laterality Date   ABDOMINAL HYSTERECTOMY     COLONOSCOPY N/A 02/19/2017   Procedure: COLONOSCOPY;  Surgeon: Malissa Hippo, MD;  Location: [facility];  Service: Endoscopy;  Laterality: N/A;  830    History reviewed. No pertinent family history.  Social History   Socioeconomic History   Marital status: Single    Spouse name: Not on file   Number of children: Not on file   Years of education: Not on file   Highest education level: Not on file  Occupational History   Not on file  Tobacco Use   Smoking status: Some Days    Pack years: 0.00    Types: Cigars   Smokeless tobacco: Never  Substance and Sexual Activity   Alcohol use: Yes    Alcohol/week: 1.0  standard drink    Types: 1 Cans of beer per week    Comment: some days   Drug use: Yes    Types: Marijuana    Comment: here and there for pain   Sexual activity: Not on file  Other Topics Concern   Not on file  Social History Narrative   Not on file   Social Determinants of Health   Financial Resource Strain: Not on file  Food Insecurity: Not on file  Transportation Needs: Not on file  Physical Activity: Not on file  Stress: Not on file  Social Connections: Not on file  Intimate Partner Violence: Not on file    ROS Review of Systems  Constitutional:  Negative for chills and fever.  HENT:  Negative for congestion, sinus pressure, sinus pain and sore throat.   Eyes:  Negative for pain and discharge.  Respiratory:  Negative for cough and shortness of breath.   Cardiovascular:  Negative for chest pain and palpitations.  Gastrointestinal:  Negative for abdominal pain, constipation, diarrhea, nausea and vomiting.  Endocrine: Negative for polydipsia and polyuria.  Genitourinary:  Negative for dysuria and hematuria.  Musculoskeletal:  Negative for neck pain and neck stiffness.  Skin:  Negative for rash.  Neurological:  Negative for dizziness and weakness.  Psychiatric/Behavioral:  Negative for agitation and behavioral problems.  Objective:   Today's Vitals: BP (!) 149/81 (BP Location: Left Arm, Patient Position: Sitting, Cuff Size: Normal)   Pulse 78   Temp 98.3 F (36.8 C) (Oral)   Resp 16   Ht 4\' 11"  (1.499 m)   Wt 151 lb 0.6 oz (68.5 kg)   SpO2 98%   BMI 30.51 kg/m   Physical Exam Vitals reviewed.  Constitutional:      General: She is not in acute distress.    Appearance: She is not diaphoretic.  HENT:     Head: Normocephalic and atraumatic.     Nose: Nose normal.     Mouth/Throat:     Mouth: Mucous membranes are moist.  Eyes:     General: No scleral icterus.    Extraocular Movements: Extraocular movements intact.  Cardiovascular:     Rate and Rhythm:  Normal rate and regular rhythm.     Pulses: Normal pulses.     Heart sounds: Normal heart sounds. No murmur heard. Pulmonary:     Breath sounds: Normal breath sounds. No wheezing or rales.  Abdominal:     Palpations: Abdomen is soft.     Tenderness: There is no abdominal tenderness.  Musculoskeletal:     Cervical back: Neck supple. No tenderness.     Right lower leg: No edema.     Left lower leg: No edema.  Skin:    General: Skin is warm.     Findings: No rash.     Comments: Skin tag over neck region  Neurological:     General: No focal deficit present.     Mental Status: She is alert and oriented to person, place, and time.     Sensory: No sensory deficit.     Motor: No weakness.  Psychiatric:        Mood and Affect: Mood normal.        Behavior: Behavior normal.    Assessment & Plan:   Problem List Items Addressed This Visit       Cardiovascular and Mediastinum   Benign essential hypertension    BP Readings from Last 1 Encounters:  08/27/20 (!) 149/81  Elevated, could be due to anxiety related to first visit, will recheck in 6 weeks Counseled for compliance with the medications Advised DASH diet and moderate exercise/walking, at least 150 mins/week          Respiratory   Allergic sinusitis    Symptoms consistent with allergies Continue Zyrtec, if persistent symptoms, will change or add another agent Flonase Nasal saline spray PRN         Digestive   GERD (gastroesophageal reflux disease)    On Pantoprazole       Relevant Medications   pantoprazole (PROTONIX) 40 MG tablet     Genitourinary   Asymptomatic microscopic hematuria    Long-standing history, benign Had Urology visit recently for renal cysts         Other   Anxiety    On Effexor 37.5 mg QD and Xanax 0.5 mg BID PRN       Relevant Medications   ALPRAZolam (XANAX) 0.5 MG tablet   venlafaxine XR (EFFEXOR-XR) 37.5 MG 24 hr capsule   Prediabetes    Continue to follow low carb  diet Will check HbA1C in next visit       Mixed hyperlipidemia    On Atorvastatin       Encounter to establish care - Primary    Care established Previous chart reviewed History and medications  reviewed with the patient       Other Visit Diagnoses     Skin tag     Observe for now If cosmetically disturbing, will refer to Dermatology for removal       Outpatient Encounter Medications as of 08/27/2020  Medication Sig   ALPRAZolam (XANAX) 0.5 MG tablet Take 0.5 mg by mouth 2 (two) times daily as needed for anxiety. Take 1 tablet by mouth twice daily as needed for anxiety   amLODipine (NORVASC) 10 MG tablet Take 10 mg by mouth daily.   aspirin EC 81 MG tablet Take 81 mg by mouth daily.   atorvastatin (LIPITOR) 10 MG tablet Take 5 mg by mouth daily.   hydrochlorothiazide (MICROZIDE) 12.5 MG capsule Take 12.5 mg by mouth daily.   pantoprazole (PROTONIX) 40 MG tablet Take 40 mg by mouth daily. Take one tablet by mouth everyday before supper   venlafaxine XR (EFFEXOR-XR) 37.5 MG 24 hr capsule Take 37.5 mg by mouth daily with breakfast. Take one capsule by mouth every day   No facility-administered encounter medications on file as of 08/27/2020.    Follow-up: Return in about 6 years (around 08/28/2026) for HTN, allergies and HbA1C check.   Anabel Halon, MD

## 2020-08-27 NOTE — Assessment & Plan Note (Signed)
Symptoms consistent with allergies Continue Zyrtec, if persistent symptoms, will change or add another agent Flonase Nasal saline spray PRN

## 2020-08-27 NOTE — Assessment & Plan Note (Signed)
Long-standing history, benign Had Urology visit recently for renal cysts

## 2020-08-27 NOTE — Patient Instructions (Signed)
Please use Flonase regularly. Continue to take Zyrtec for allergies. Okay to use Nasal saline spray or Vaporub mist for sinus relief.  Please use humidifier at nighttime to avoid dry air.  Please continue to take other medications as prescribed.

## 2020-08-27 NOTE — Assessment & Plan Note (Signed)
On Effexor 37.5 mg QD and Xanax 0.5 mg BID PRN

## 2020-08-27 NOTE — Assessment & Plan Note (Signed)
BP Readings from Last 1 Encounters:  08/27/20 (!) 149/81   Elevated, could be due to anxiety related to first visit, will recheck in 6 weeks Counseled for compliance with the medications Advised DASH diet and moderate exercise/walking, at least 150 mins/week

## 2020-08-27 NOTE — Assessment & Plan Note (Signed)
Continue to follow low carb diet Will check HbA1C in next visit

## 2020-08-27 NOTE — Assessment & Plan Note (Signed)
Care established Previous chart reviewed History and medications reviewed with the patient 

## 2020-08-27 NOTE — Assessment & Plan Note (Signed)
On Pantoprazole 

## 2020-09-07 DIAGNOSIS — H524 Presbyopia: Secondary | ICD-10-CM | POA: Diagnosis not present

## 2020-09-07 LAB — HM DIABETES EYE EXAM

## 2020-09-11 ENCOUNTER — Ambulatory Visit: Payer: Medicare PPO | Admitting: Urology

## 2020-10-02 ENCOUNTER — Other Ambulatory Visit: Payer: Self-pay | Admitting: *Deleted

## 2020-10-02 ENCOUNTER — Other Ambulatory Visit: Payer: Self-pay

## 2020-10-02 ENCOUNTER — Ambulatory Visit (INDEPENDENT_AMBULATORY_CARE_PROVIDER_SITE_OTHER): Payer: Medicare PPO | Admitting: *Deleted

## 2020-10-02 DIAGNOSIS — Z0000 Encounter for general adult medical examination without abnormal findings: Secondary | ICD-10-CM

## 2020-10-02 MED ORDER — HYDROCHLOROTHIAZIDE 12.5 MG PO CAPS: 12.5000 mg | ORAL_CAPSULE | Freq: Every day | ORAL | 1 refills | Status: DC

## 2020-10-02 NOTE — Progress Notes (Signed)
Subjective:   Robin Bridges is a 75 y.o. female who presents for Medicare Annual (Subsequent) preventive examination.  Review of Systems           Objective:    There were no vitals filed for this visit. There is no height or weight on file to calculate BMI.  Advanced Directives 02/19/2017  Does Patient Have a Medical Advance Directive? No  Would patient like information on creating a medical advance directive? No - Patient declined    Current Medications (verified) Outpatient Encounter Medications as of 10/02/2020  Medication Sig   ALPRAZolam (XANAX) 0.5 MG tablet Take 0.5 mg by mouth 2 (two) times daily as needed for anxiety. Take 1 tablet by mouth twice daily as needed for anxiety   amLODipine (NORVASC) 10 MG tablet Take 10 mg by mouth daily.   aspirin EC 81 MG tablet Take 81 mg by mouth daily.   atorvastatin (LIPITOR) 10 MG tablet Take 5 mg by mouth daily.   hydrochlorothiazide (MICROZIDE) 12.5 MG capsule Take 12.5 mg by mouth daily.   pantoprazole (PROTONIX) 40 MG tablet Take 40 mg by mouth daily. Take one tablet by mouth everyday before supper   venlafaxine XR (EFFEXOR-XR) 37.5 MG 24 hr capsule Take 37.5 mg by mouth daily with breakfast. Take one capsule by mouth every day   No facility-administered encounter medications on file as of 10/02/2020.    Allergies (verified) Patient has no known allergies.   History: Past Medical History:  Diagnosis Date   Hypertension    Past Surgical History:  Procedure Laterality Date   ABDOMINAL HYSTERECTOMY     COLONOSCOPY N/A 02/19/2017   Procedure: COLONOSCOPY;  Surgeon: Malissa Hippo, MD;  Location: [facility];  Service: Endoscopy;  Laterality: N/A;  830   No family history on file. Social History   Socioeconomic History   Marital status: Single    Spouse name: Not on file   Number of children: Not on file   Years of education: Not on file   Highest education level: Not on file  Occupational History   Not on  file  Tobacco Use   Smoking status: Some Days    Types: Cigars   Smokeless tobacco: Never  Substance and Sexual Activity   Alcohol use: Yes    Alcohol/week: 1.0 standard drink    Types: 1 Cans of beer per week    Comment: some days   Drug use: Yes    Types: Marijuana    Comment: here and there for pain   Sexual activity: Not on file  Other Topics Concern   Not on file  Social History Narrative   Not on file   Social Determinants of Health   Financial Resource Strain: Not on file  Food Insecurity: Not on file  Transportation Needs: Not on file  Physical Activity: Not on file  Stress: Not on file  Social Connections: Not on file    Tobacco Counseling Ready to quit: Not Answered Counseling given: Not Answered   Clinical Intake:                 Diabetic?no         Activities of Daily Living In your present state of health, do you have any difficulty performing the following activities: 08/27/2020  Hearing? N  Vision? N  Difficulty concentrating or making decisions? N  Walking or climbing stairs? N  Dressing or bathing? N  Doing errands, shopping? N  Some recent data  might be hidden    Patient Care Team: Anabel Halon, MD as PCP - General (Internal Medicine)  Indicate any recent Medical Services you may have received from other than [facility] providers in the past year (date may be approximate).     Assessment:   This is a routine wellness examination for Robin Bridges.  Hearing/Vision screen No results found.  Dietary issues and exercise activities discussed:     Goals Addressed   None   Depression Screen PHQ 2/9 Scores 08/27/2020  PHQ - 2 Score 1    Fall Risk Fall Risk  08/27/2020  Falls in the past year? 0  Number falls in past yr: 0  Injury with Fall? 0  Risk for fall due to : No Fall Risks  Follow up Falls evaluation completed    FALL RISK PREVENTION PERTAINING TO THE HOME:  Any stairs in or around the home? No  If so, are there any  without handrails? No  Home free of loose throw rugs in walkways, pet beds, electrical cords, etc? Yes  Adequate lighting in your home to reduce risk of falls? Yes   ASSISTIVE DEVICES UTILIZED TO PREVENT FALLS:  Life alert? No  Use of a cane, walker or w/c? No  Grab bars in the bathroom? Yes  Shower chair or bench in shower? Yes  Elevated toilet seat or a handicapped toilet? Yes   TIMED UP AND GO:  Was the test performed? No .  Length of time to ambulate 10 feet: NA sec.     Cognitive Function:        Immunizations Immunization History  Administered Date(s) Administered   Influenza-Unspecified 11/08/2013   Moderna SARS-COV2 Booster Vaccination 02/14/2020   Moderna Sars-Covid-2 Vaccination 05/10/2019, 06/07/2019    TDAP status: Due, Education has been provided regarding the importance of this vaccine. Advised may receive this vaccine at local pharmacy or Health Dept. Aware to provide a copy of the vaccination record if obtained from local pharmacy or Health Dept. Verbalized acceptance and understanding.  Flu Vaccine status: Up to date  Pneumococcal vaccine status: Up to date  Covid-19 vaccine status: Completed vaccines  Qualifies for Shingles Vaccine? Yes   Zostavax completed Yes   Shingrix Completed?: No.    Education has been provided regarding the importance of this vaccine. Patient has been advised to call insurance company to determine out of pocket expense if they have not yet received this vaccine. Advised may also receive vaccine at local pharmacy or Health Dept. Verbalized acceptance and understanding.  Screening Tests Health Maintenance  Topic Date Due   HEMOGLOBIN A1C  Never done   FOOT EXAM  Never done   OPHTHALMOLOGY EXAM  Never done   URINE MICROALBUMIN  Never done   Hepatitis C Screening  Never done   TETANUS/TDAP  Never done   Zoster Vaccines- Shingrix (1 of 2) Never done   MAMMOGRAM  Never done   DEXA SCAN  Never done   PNA vac Low Risk Adult (1  of 2 - PCV13) Never done   COVID-19 Vaccine (4 - Booster for Moderna series) 05/14/2020   INFLUENZA VACCINE  10/08/2020   COLONOSCOPY (Pts 45-60yrs Insurance coverage will need to be confirmed)  02/20/2024   HPV VACCINES  Aged Out    Health Maintenance  Health Maintenance Due  Topic Date Due   HEMOGLOBIN A1C  Never done   FOOT EXAM  Never done   OPHTHALMOLOGY EXAM  Never done   URINE MICROALBUMIN  Never  done   Hepatitis C Screening  Never done   TETANUS/TDAP  Never done   Zoster Vaccines- Shingrix (1 of 2) Never done   MAMMOGRAM  Never done   DEXA SCAN  Never done   PNA vac Low Risk Adult (1 of 2 - PCV13) Never done   COVID-19 Vaccine (4 - Booster for Moderna series) 05/14/2020    Colorectal cancer screening: Type of screening: Colonoscopy. Completed 02-19-17. Repeat every 7 years  Mammogram status: Completed 4-22. Repeat every year  Bone Density status: Ordered 10-02-20. Pt provided with contact info and advised to call to schedule appt.  Lung Cancer Screening: (Low Dose CT Chest recommended if Age 74-80 years, 30 pack-year currently smoking OR have quit w/in 15years.) does qualify.   Lung Cancer Screening Referral: Yes  Additional Screening:  Hepatitis C Screening: does qualify; ordered    Vision Screening: Recommended annual ophthalmology exams for early detection of glaucoma and other disorders of the eye. Is the patient up to date with their annual eye exam?  Yes  Who is the provider or what is the name of the office in which the patient attends annual eye exams? [facility]  If pt is not established with a provider, would they like to be referred to a provider to establish care? No .   Dental Screening: Recommended annual dental exams for proper oral hygiene  Community Resource Referral / Chronic Care Management: CRR required this visit?  Yes   CCM required this visit?  Yes      Plan:     I have personally reviewed and noted the following in the  patient's chart:   Medical and social history Use of alcohol, tobacco or illicit drugs  Current medications and supplements including opioid prescriptions.  Functional ability and status Nutritional status Physical activity Advanced directives List of other physicians Hospitalizations, surgeries, and ER visits in previous 12 months Vitals Screenings to include cognitive, depression, and falls Referrals and appointments  In addition, I have reviewed and discussed with patient certain preventive protocols, quality metrics, and best practice recommendations. A written personalized care plan for preventive services as well as general preventive health recommendations were provided to patient.     Park Breed, CMA   10/02/2020   Nurse Notes: Telehealth visit, Spent 40 mins talking with patient

## 2020-10-02 NOTE — Patient Instructions (Signed)
Robin Bridges , Thank you for taking time to come for your Medicare Wellness Visit. I appreciate your ongoing commitment to your health goals. Please review the following plan we discussed and let me know if I can assist you in the future.   Screening recommendations/referrals: Colonoscopy: Completed Due 02-20-24 Mammogram: Completed per patient Bone Density: Ordered  Recommended yearly ophthalmology/optometry visit for glaucoma screening and checkup Recommended yearly dental visit for hygiene and checkup  Vaccinations: Influenza vaccine: Completed Due 10-08-20 Pneumococcal vaccine: Completed per patient Tdap vaccine: Completed per patient Shingles vaccine: Patient has one is due for second. Information provided    Advanced directives: Patient declined information  Conditions/risks identified: Hypertension  Next appointment: 1 year    Preventive Care 40 Years and Older, Female Preventive care refers to lifestyle choices and visits with your health care provider that can promote health and wellness. What does preventive care include? A yearly physical exam. This is also called an annual well check. Dental exams once or twice a year. Routine eye exams. Ask your health care provider how often you should have your eyes checked. Personal lifestyle choices, including: Daily care of your teeth and gums. Regular physical activity. Eating a healthy diet. Avoiding tobacco and drug use. Limiting alcohol use. Practicing safe sex. Taking low-dose aspirin every day. Taking vitamin and mineral supplements as recommended by your health care provider. What happens during an annual well check? The services and screenings done by your health care provider during your annual well check will depend on your age, overall health, lifestyle risk factors, and family history of disease. Counseling  Your health care provider may ask you questions about your: Alcohol use. Tobacco use. Drug use. Emotional  well-being. Home and relationship well-being. Sexual activity. Eating habits. History of falls. Memory and ability to understand (cognition). Work and work Astronomer. Reproductive health. Screening  You may have the following tests or measurements: Height, weight, and BMI. Blood pressure. Lipid and cholesterol levels. These may be checked every 5 years, or more frequently if you are over 26 years old. Skin check. Lung cancer screening. You may have this screening every year starting at age 63 if you have a 30-pack-year history of smoking and currently smoke or have quit within the past 15 years. Fecal occult blood test (FOBT) of the stool. You may have this test every year starting at age 58. Flexible sigmoidoscopy or colonoscopy. You may have a sigmoidoscopy every 5 years or a colonoscopy every 10 years starting at age 53. Hepatitis C blood test. Hepatitis B blood test. Sexually transmitted disease (STD) testing. Diabetes screening. This is done by checking your blood sugar (glucose) after you have not eaten for a while (fasting). You may have this done every 1-3 years. Bone density scan. This is done to screen for osteoporosis. You may have this done starting at age 22. Mammogram. This may be done every 1-2 years. Talk to your health care provider about how often you should have regular mammograms. Talk with your health care provider about your test results, treatment options, and if necessary, the need for more tests. Vaccines  Your health care provider may recommend certain vaccines, such as: Influenza vaccine. This is recommended every year. Tetanus, diphtheria, and acellular pertussis (Tdap, Td) vaccine. You may need a Td booster every 10 years. Zoster vaccine. You may need this after age 40. Pneumococcal 13-valent conjugate (PCV13) vaccine. One dose is recommended after age 3. Pneumococcal polysaccharide (PPSV23) vaccine. One dose is recommended after age 32.  Talk to your  health care provider about which screenings and vaccines you need and how often you need them. This information is not intended to replace advice given to you by your health care provider. Make sure you discuss any questions you have with your health care provider. Document Released: 03/23/2015 Document Revised: 11/14/2015 Document Reviewed: 12/26/2014 Elsevier Interactive Patient Education  2017 [employer] Prevention in the Home Falls can cause injuries. They can happen to people of all ages. There are many things you can do to make your home safe and to help prevent falls. What can I do on the outside of my home? Regularly fix the edges of walkways and driveways and fix any cracks. Remove anything that might make you trip as you walk through a door, such as a raised step or threshold. Trim any bushes or trees on the path to your home. Use bright outdoor lighting. Clear any walking paths of anything that might make someone trip, such as rocks or tools. Regularly check to see if handrails are loose or broken. Make sure that both sides of any steps have handrails. Any raised decks and porches should have guardrails on the edges. Have any leaves, snow, or ice cleared regularly. Use sand or salt on walking paths during winter. Clean up any spills in your garage right away. This includes oil or grease spills. What can I do in the bathroom? Use night lights. Install grab bars by the toilet and in the tub and shower. Do not use towel bars as grab bars. Use non-skid mats or decals in the tub or shower. If you need to sit down in the shower, use a plastic, non-slip stool. Keep the floor dry. Clean up any water that spills on the floor as soon as it happens. Remove soap buildup in the tub or shower regularly. Attach bath mats securely with double-sided non-slip rug tape. Do not have throw rugs and other things on the floor that can make you trip. What can I do in the bedroom? Use night  lights. Make sure that you have a light by your bed that is easy to reach. Do not use any sheets or blankets that are too big for your bed. They should not hang down onto the floor. Have a firm chair that has side arms. You can use this for support while you get dressed. Do not have throw rugs and other things on the floor that can make you trip. What can I do in the kitchen? Clean up any spills right away. Avoid walking on wet floors. Keep items that you use a lot in easy-to-reach places. If you need to reach something above you, use a strong step stool that has a grab bar. Keep electrical cords out of the way. Do not use floor polish or wax that makes floors slippery. If you must use wax, use non-skid floor wax. Do not have throw rugs and other things on the floor that can make you trip. What can I do with my stairs? Do not leave any items on the stairs. Make sure that there are handrails on both sides of the stairs and use them. Fix handrails that are broken or loose. Make sure that handrails are as long as the stairways. Check any carpeting to make sure that it is firmly attached to the stairs. Fix any carpet that is loose or worn. Avoid having throw rugs at the top or bottom of the stairs. If you do have throw  rugs, attach them to the floor with carpet tape. Make sure that you have a light switch at the top of the stairs and the bottom of the stairs. If you do not have them, ask someone to add them for you. What else can I do to help prevent falls? Wear shoes that: Do not have high heels. Have rubber bottoms. Are comfortable and fit you well. Are closed at the toe. Do not wear sandals. If you use a stepladder: Make sure that it is fully opened. Do not climb a closed stepladder. Make sure that both sides of the stepladder are locked into place. Ask someone to hold it for you, if possible. Clearly mark and make sure that you can see: Any grab bars or handrails. First and last  steps. Where the edge of each step is. Use tools that help you move around (mobility aids) if they are needed. These include: Canes. Walkers. Scooters. Crutches. Turn on the lights when you go into a dark area. Replace any light bulbs as soon as they burn out. Set up your furniture so you have a clear path. Avoid moving your furniture around. If any of your floors are uneven, fix them. If there are any pets around you, be aware of where they are. Review your medicines with your doctor. Some medicines can make you feel dizzy. This can increase your chance of falling. Ask your doctor what other things that you can do to help prevent falls. This information is not intended to replace advice given to you by your health care provider. Make sure you discuss any questions you have with your health care provider. Document Released: 12/21/2008 Document Revised: 08/02/2015 Document Reviewed: 03/31/2014 Elsevier Interactive Patient Education  2017 [employer].

## 2020-10-08 ENCOUNTER — Other Ambulatory Visit: Payer: Self-pay

## 2020-10-08 ENCOUNTER — Encounter: Payer: Self-pay | Admitting: Internal Medicine

## 2020-10-08 ENCOUNTER — Ambulatory Visit: Payer: Medicare PPO | Admitting: Internal Medicine

## 2020-10-08 VITALS — BP 131/81 | HR 88 | Temp 98.5°F | Resp 18 | Ht 59.0 in | Wt 153.1 lb

## 2020-10-08 DIAGNOSIS — Z780 Asymptomatic menopausal state: Secondary | ICD-10-CM | POA: Diagnosis not present

## 2020-10-08 DIAGNOSIS — B351 Tinea unguium: Secondary | ICD-10-CM

## 2020-10-08 DIAGNOSIS — R7303 Prediabetes: Secondary | ICD-10-CM

## 2020-10-08 DIAGNOSIS — I10 Essential (primary) hypertension: Secondary | ICD-10-CM | POA: Diagnosis not present

## 2020-10-08 DIAGNOSIS — L80 Vitiligo: Secondary | ICD-10-CM | POA: Diagnosis not present

## 2020-10-08 DIAGNOSIS — Z1159 Encounter for screening for other viral diseases: Secondary | ICD-10-CM | POA: Diagnosis not present

## 2020-10-08 LAB — POCT GLYCOSYLATED HEMOGLOBIN (HGB A1C)
HbA1c, POC (controlled diabetic range): 6.3 % (ref 0.0–7.0)
HbA1c, POC (prediabetic range): 6.3 % (ref 5.7–6.4)

## 2020-10-08 MED ORDER — TERBINAFINE HCL 250 MG PO TABS: 250.0000 mg | ORAL_TABLET | Freq: Every day | ORAL | 2 refills | Status: DC

## 2020-10-08 MED ORDER — TACROLIMUS 0.1 % EX OINT: TOPICAL_OINTMENT | Freq: Two times a day (BID) | CUTANEOUS | 0 refills | Status: AC

## 2020-10-08 NOTE — Patient Instructions (Signed)
Please start taking Terbinafine as prescribed for toenail infection.  Please apply Tacrolimus cream for vitiligo/lighter skin spots.  Please continue to take other medications as prescribed.  Please continue to follow DASH diet and ambulate as tolerated.  Please get fasting blood tests done before the next visit.

## 2020-10-09 ENCOUNTER — Encounter: Payer: Self-pay | Admitting: *Deleted

## 2020-10-10 ENCOUNTER — Other Ambulatory Visit: Payer: Self-pay | Admitting: *Deleted

## 2020-10-10 ENCOUNTER — Telehealth: Payer: Self-pay | Admitting: Internal Medicine

## 2020-10-10 DIAGNOSIS — L80 Vitiligo: Secondary | ICD-10-CM

## 2020-10-10 LAB — MICROALBUMIN, URINE: Microalbumin, Urine: <3 ug/mL

## 2020-10-10 NOTE — Telephone Encounter (Signed)
Wants to know how many grams to apply with each application for auditing purposes. Please advise

## 2020-10-10 NOTE — Telephone Encounter (Signed)
Please call Irving Burton to advise of the grams for the ointment

## 2020-10-10 NOTE — Telephone Encounter (Signed)
Irving Burton advised with verbal understanding

## 2020-10-13 NOTE — Assessment & Plan Note (Signed)
Started tacrolimus cream If a persistent concern, will refer to dermatology.

## 2020-10-13 NOTE — Assessment & Plan Note (Signed)
Started Lamisil

## 2020-10-13 NOTE — Assessment & Plan Note (Signed)
BP Readings from Last 1 Encounters:  10/08/20 131/81   Well-controlled with amlodipine and HCTZ Counseled for compliance with the medications Advised DASH diet and moderate exercise/walking, at least 150 mins/week

## 2020-10-13 NOTE — Progress Notes (Addendum)
Established Patient Office Visit  Subjective:  Patient ID: Robin Bridges, female    DOB: 1946/03/07  Age: 75 y.o. MRN: [id number]  CC:  Chief Complaint  Patient presents with   Follow-up    6 week follow up pt has stopped smoking she has lighter spots on face has been there for 7 years and started spreading she has cream for spots but this has not helped she has had cough for about 1 month that wont go away     HPI Robin Bridges a 75 year old female with PMH of HTN, HLD, prediabetes, GERD, anxiety and tobacco abuse who presents for follow-up of her chronic medical conditions.  HTN: BP is well-controlled. Takes medications regularly. Patient denies headache, dizziness, chest pain, dyspnea or palpitations.  She has been having white spots on her face, which has been spreading lately.  She has history of vitiligo.  Denies any itching or irritation.  She has been having dry cough especially in the morning.  Denies any dyspnea, fever, chills, nausea or vomiting.  She has quit smoking now.  Past Medical History:  Diagnosis Date   Hypertension     Past Surgical History:  Procedure Laterality Date   ABDOMINAL HYSTERECTOMY     COLONOSCOPY N/A 02/19/2017   Procedure: COLONOSCOPY;  Surgeon: Rogene Houston, MD;  Location: [facility];  Service: Endoscopy;  Laterality: N/A;  830    History reviewed. No pertinent family history.  Social History   Socioeconomic History   Marital status: Single    Spouse name: Not on file   Number of children: Not on file   Years of education: Not on file   Highest education level: Not on file  Occupational History   Not on file  Tobacco Use   Smoking status: Former    Types: Cigars   Smokeless tobacco: Never  Substance and Sexual Activity   Alcohol use: Yes    Alcohol/week: 1.0 standard drink    Types: 1 Cans of beer per week    Comment: some days   Drug use: Yes    Types: Marijuana    Comment: here and there for pain   Sexual  activity: Not on file  Other Topics Concern   Not on file  Social History Narrative   Not on file   Social Determinants of Health   Financial Resource Strain: Low Risk    Difficulty of Paying Living Expenses: Not hard at all  Food Insecurity: No Food Insecurity   Worried About Charity fundraiser in the Last Year: Never true   Ran Out of Food in the Last Year: Never true  Transportation Needs: No Transportation Needs   Lack of Transportation (Medical): No   Lack of Transportation (Non-Medical): No  Physical Activity: Insufficiently Active   Days of Exercise per Week: 2 days   Minutes of Exercise per Session: 60 min  Stress: No Stress Concern Present   Feeling of Stress : Not at all  Social Connections: Moderately Isolated   Frequency of Communication with Friends and Family: More than three times a week   Frequency of Social Gatherings with Friends and Family: More than three times a week   Attends Religious Services: More than 4 times per year   Active Member of [employer] or Organizations: No   Attends Archivist Meetings: Never   Marital Status: Widowed  Intimate Partner Violence: Not At Risk   Fear of Current or Ex-Partner: No   Emotionally  Abused: No   Physically Abused: No   Sexually Abused: No    Outpatient Medications Prior to Visit  Medication Sig Dispense Refill   ALPRAZolam (XANAX) 0.5 MG tablet Take 0.5 mg by mouth 2 (two) times daily as needed for anxiety. Take 1 tablet by mouth twice daily as needed for anxiety     amLODipine (NORVASC) 10 MG tablet Take 10 mg by mouth daily.     aspirin EC 81 MG tablet Take 81 mg by mouth daily.     hydrochlorothiazide (MICROZIDE) 12.5 MG capsule Take 1 capsule (12.5 mg total) by mouth daily. 90 capsule 1   pantoprazole (PROTONIX) 40 MG tablet Take 40 mg by mouth daily. Take one tablet by mouth everyday before supper     venlafaxine XR (EFFEXOR-XR) 37.5 MG 24 hr capsule Take 37.5 mg by mouth daily with breakfast. Take one  capsule by mouth every day     atorvastatin (LIPITOR) 10 MG tablet Take 5 mg by mouth daily.     No facility-administered medications prior to visit.    No Known Allergies  ROS Review of Systems  Constitutional:  Negative for chills and fever.  HENT:  Negative for congestion, sinus pressure, sinus pain and sore throat.   Eyes:  Negative for pain and discharge.  Respiratory:  Negative for cough and shortness of breath.   Cardiovascular:  Negative for chest pain and palpitations.  Gastrointestinal:  Negative for abdominal pain, constipation, diarrhea, nausea and vomiting.  Endocrine: Negative for polydipsia and polyuria.  Genitourinary:  Negative for dysuria and hematuria.  Musculoskeletal:  Negative for neck pain and neck stiffness.  Skin:  Positive for color change.  Neurological:  Negative for dizziness and weakness.  Psychiatric/Behavioral:  Negative for agitation and behavioral problems.      Objective:    Physical Exam Vitals reviewed.  Constitutional:      General: She is not in acute distress.    Appearance: She is not diaphoretic.  HENT:     Head: Normocephalic and atraumatic.     Nose: Nose normal.     Mouth/Throat:     Mouth: Mucous membranes are moist.  Eyes:     General: No scleral icterus.    Extraocular Movements: Extraocular movements intact.  Cardiovascular:     Rate and Rhythm: Normal rate and regular rhythm.     Pulses: Normal pulses.     Heart sounds: Normal heart sounds. No murmur heard. Pulmonary:     Breath sounds: Normal breath sounds. No wheezing or rales.  Abdominal:     Palpations: Abdomen is soft.     Tenderness: There is no abdominal tenderness.  Musculoskeletal:     Cervical back: Neck supple. No tenderness.     Right lower leg: No edema.     Left lower leg: No edema.  Skin:    General: Skin is warm.     Findings: No rash.     Comments: Lighter spots around nostrils Skin tag over neck region  Neurological:     General: No focal  deficit present.     Mental Status: She is alert and oriented to person, place, and time.     Sensory: No sensory deficit.     Motor: No weakness.  Psychiatric:        Mood and Affect: Mood normal.        Behavior: Behavior normal.    BP 131/81 (BP Location: Left Arm, Patient Position: Sitting, Cuff Size: Normal)   Pulse 88  Temp 98.5 F (36.9 C) (Oral)   Resp 18   Ht _0  (1.499 m)   Wt 153 lb 1.9 oz (69.5 kg)   SpO2 97%   BMI 30.93 kg/m  Wt Readings from Last 3 Encounters:  02/11/21 148 lb 1.3 oz (67.2 kg)  10/08/20 153 lb 1.9 oz (69.5 kg)  08/27/20 151 lb 0.6 oz (68.5 kg)     Health Maintenance Due  Topic Date Due   Pneumonia Vaccine 52+ Years old (1 - PCV) Never done   TETANUS/TDAP  Never done   Zoster Vaccines- Shingrix (1 of 2) Never done   MAMMOGRAM  Never done    There are no preventive care reminders to display for this patient.  No results found for: TSH Lab Results  Component Value Date   WBC 6.9 02/08/2021   HGB 14.0 02/08/2021   HCT 42.6 02/08/2021   MCV 86 02/08/2021   PLT 295 02/08/2021   Lab Results  Component Value Date   NA 140 02/08/2021   K 4.3 02/08/2021   CO2 22 02/08/2021   GLUCOSE 108 (H) 02/08/2021   BUN 16 02/08/2021   CREATININE 0.90 02/08/2021   BILITOT 0.2 02/08/2021   ALKPHOS 50 02/08/2021   AST 27 02/08/2021   ALT 36 (H) 02/08/2021   PROT 6.7 02/08/2021   ALBUMIN 4.5 02/08/2021   CALCIUM 9.3 02/08/2021   EGFR 68 02/08/2021   Lab Results  Component Value Date   CHOL 235 (H) 02/08/2021   Lab Results  Component Value Date   HDL 60 02/08/2021   Lab Results  Component Value Date   LDLCALC 145 (H) 02/08/2021   Lab Results  Component Value Date   TRIG 171 (H) 02/08/2021   Lab Results  Component Value Date   CHOLHDL 3.9 02/08/2021   Lab Results  Component Value Date   HGBA1C 6.3 10/08/2020   HGBA1C 6.3 10/08/2020      Assessment & Plan:   Problem List Items Addressed This Visit       Cardiovascular  and Mediastinum   Benign essential hypertension - Primary    BP Readings from Last 1 Encounters:  10/08/20 131/81  Well-controlled with amlodipine and HCTZ Counseled for compliance with the medications Advised DASH diet and moderate exercise/walking, at least 150 mins/week      Relevant Orders   CBC with Differential/Platelet (Completed)   CMP14+EGFR (Completed)   Lipid Profile (Completed)     Musculoskeletal and Integument   Vitiligo    Started tacrolimus cream If a persistent concern, will refer to dermatology.      Relevant Medications   tacrolimus (PROTOPIC) 0.1 % ointment   Onychomycosis    Started Lamisil      Relevant Medications   tacrolimus (PROTOPIC) 0.1 % ointment   terbinafine (LAMISIL) 250 MG tablet     Other   Prediabetes    Continue to follow low carb diet Check HbA1C      Relevant Orders   Microalbumin, urine (Completed)   POCT glycosylated hemoglobin (Hb A1C) (Completed)   CMP14+EGFR (Completed)   Lipid Profile (Completed)   Other Visit Diagnoses     Post-menopausal       Relevant Orders   DG Bone Density (Completed)   Need for hepatitis C screening test       Relevant Orders   Hepatitis C Antibody (Completed)       Meds ordered this encounter  Medications   tacrolimus (PROTOPIC) 0.1 % ointment  Sig: Apply topically 2 (two) times daily.    Dispense:  100 g    Refill:  0   terbinafine (LAMISIL) 250 MG tablet    Sig: Take 1 tablet (250 mg total) by mouth daily.    Dispense:  28 tablet    Refill:  2    Follow-up: Return in about 4 months (around 02/07/2021) for HTN and toenail infection.    Lindell Spar, MD

## 2020-10-13 NOTE — Assessment & Plan Note (Signed)
Continue to follow low carb diet Check HbA1C

## 2020-10-22 NOTE — Progress Notes (Signed)
I connected with  Robin Bridges on 10/22/20 by an audio enabled telemedicine application and verified that I am speaking with the correct person using two identifiers.   I discussed the limitations, risks, security and privacy concerns of performing an evaluation and management service by telephone and the availability of in person appointments. I also discussed with the patient that there may be a patient responsible charge related to this service. The patient expressed understanding and verbally consented to this telephonic visit.  The patient was at home. The provider was Bjorn Pippin NP who was in office. This was a telehealth visit.

## 2020-10-29 DIAGNOSIS — H2512 Age-related nuclear cataract, left eye: Secondary | ICD-10-CM | POA: Diagnosis not present

## 2020-10-29 DIAGNOSIS — H524 Presbyopia: Secondary | ICD-10-CM | POA: Diagnosis not present

## 2020-10-29 DIAGNOSIS — H35363 Drusen (degenerative) of macula, bilateral: Secondary | ICD-10-CM | POA: Diagnosis not present

## 2020-10-29 DIAGNOSIS — H353122 Nonexudative age-related macular degeneration, left eye, intermediate dry stage: Secondary | ICD-10-CM | POA: Diagnosis not present

## 2020-10-29 DIAGNOSIS — H2513 Age-related nuclear cataract, bilateral: Secondary | ICD-10-CM | POA: Diagnosis not present

## 2020-10-29 DIAGNOSIS — H25013 Cortical age-related cataract, bilateral: Secondary | ICD-10-CM | POA: Diagnosis not present

## 2020-10-30 DIAGNOSIS — F32A Depression, unspecified: Secondary | ICD-10-CM | POA: Diagnosis not present

## 2020-10-30 DIAGNOSIS — I10 Essential (primary) hypertension: Secondary | ICD-10-CM | POA: Diagnosis not present

## 2020-10-30 DIAGNOSIS — B351 Tinea unguium: Secondary | ICD-10-CM | POA: Diagnosis not present

## 2020-10-30 DIAGNOSIS — F419 Anxiety disorder, unspecified: Secondary | ICD-10-CM | POA: Diagnosis not present

## 2020-10-30 DIAGNOSIS — Z87891 Personal history of nicotine dependence: Secondary | ICD-10-CM | POA: Diagnosis not present

## 2020-10-30 DIAGNOSIS — J309 Allergic rhinitis, unspecified: Secondary | ICD-10-CM | POA: Diagnosis not present

## 2020-10-30 DIAGNOSIS — K219 Gastro-esophageal reflux disease without esophagitis: Secondary | ICD-10-CM | POA: Diagnosis not present

## 2020-10-30 DIAGNOSIS — E669 Obesity, unspecified: Secondary | ICD-10-CM | POA: Diagnosis not present

## 2020-10-30 DIAGNOSIS — E785 Hyperlipidemia, unspecified: Secondary | ICD-10-CM | POA: Diagnosis not present

## 2020-10-30 DIAGNOSIS — K08409 Partial loss of teeth, unspecified cause, unspecified class: Secondary | ICD-10-CM | POA: Diagnosis not present

## 2020-11-13 DIAGNOSIS — H25812 Combined forms of age-related cataract, left eye: Secondary | ICD-10-CM | POA: Diagnosis not present

## 2020-11-13 DIAGNOSIS — H2512 Age-related nuclear cataract, left eye: Secondary | ICD-10-CM | POA: Diagnosis not present

## 2020-12-03 ENCOUNTER — Ambulatory Visit (HOSPITAL_COMMUNITY)
Admission: RE | Admit: 2020-12-03 | Discharge: 2020-12-03 | Disposition: A | Discharge status: Home / Self Care | Payer: Medicare PPO | Source: Ambulatory Visit | Attending: Internal Medicine | Admitting: Internal Medicine

## 2020-12-03 ENCOUNTER — Other Ambulatory Visit: Payer: Self-pay

## 2020-12-03 DIAGNOSIS — H2511 Age-related nuclear cataract, right eye: Secondary | ICD-10-CM | POA: Diagnosis not present

## 2020-12-03 DIAGNOSIS — H25041 Posterior subcapsular polar age-related cataract, right eye: Secondary | ICD-10-CM | POA: Diagnosis not present

## 2020-12-03 DIAGNOSIS — Z780 Asymptomatic menopausal state: Secondary | ICD-10-CM

## 2020-12-03 DIAGNOSIS — M85851 Other specified disorders of bone density and structure, right thigh: Secondary | ICD-10-CM | POA: Diagnosis not present

## 2020-12-03 DIAGNOSIS — H25011 Cortical age-related cataract, right eye: Secondary | ICD-10-CM | POA: Diagnosis not present

## 2020-12-11 DIAGNOSIS — H2511 Age-related nuclear cataract, right eye: Secondary | ICD-10-CM | POA: Diagnosis not present

## 2020-12-11 DIAGNOSIS — H25041 Posterior subcapsular polar age-related cataract, right eye: Secondary | ICD-10-CM | POA: Diagnosis not present

## 2020-12-11 DIAGNOSIS — H25811 Combined forms of age-related cataract, right eye: Secondary | ICD-10-CM | POA: Diagnosis not present

## 2020-12-11 DIAGNOSIS — H25011 Cortical age-related cataract, right eye: Secondary | ICD-10-CM | POA: Diagnosis not present

## 2020-12-19 DIAGNOSIS — H2511 Age-related nuclear cataract, right eye: Secondary | ICD-10-CM | POA: Diagnosis not present

## 2020-12-28 ENCOUNTER — Other Ambulatory Visit: Payer: Self-pay | Admitting: Internal Medicine

## 2020-12-28 DIAGNOSIS — B351 Tinea unguium: Secondary | ICD-10-CM

## 2021-01-02 ENCOUNTER — Encounter (INDEPENDENT_AMBULATORY_CARE_PROVIDER_SITE_OTHER): Payer: Self-pay

## 2021-01-02 ENCOUNTER — Encounter: Payer: Self-pay | Admitting: Family Medicine

## 2021-01-02 ENCOUNTER — Ambulatory Visit: Payer: Medicare PPO | Admitting: Family Medicine

## 2021-01-02 ENCOUNTER — Other Ambulatory Visit: Payer: Self-pay

## 2021-01-02 DIAGNOSIS — J209 Acute bronchitis, unspecified: Secondary | ICD-10-CM

## 2021-01-02 MED ORDER — AZITHROMYCIN 250 MG PO TABS: ORAL_TABLET | ORAL | 0 refills | Status: AC

## 2021-01-02 MED ORDER — BENZONATATE 100 MG PO CAPS: 100.0000 mg | ORAL_CAPSULE | Freq: Four times a day (QID) | ORAL | 0 refills | Status: DC | PRN

## 2021-01-02 NOTE — Progress Notes (Signed)
  Virtual Visit via Telephone Note  I connected with Robin Bridges on 01/02/21 at  1:40 PM EDT by telephone and verified that I am speaking with the correct person using two identifiers.  Location: Patient: home Provider: office   I discussed the limitations, risks, security and privacy concerns of performing an evaluation and management service by telephone and the availability of in person appointments. I also discussed with the patient that there may be a patient responsible charge related to this service. The patient expressed understanding and agreed to proceed.   History of Present Illness: Cough since 10/19, worse at night, has PND , yellow and clear nasal drainage, no fever or chills, no sOB, no known sick contact   Observations/Objective: BP 128/78  Good communication with no confusion and intact memory. Alert and oriented x 3 Coughing during speech   Assessment and Plan:  Acute bronchitis Azithromycin and tessalon perles are prescribed  Follow Up Instructions:    I discussed the assessment and treatment plan with the patient. The patient was provided an opportunity to ask questions and all were answered. The patient agreed with the plan and demonstrated an understanding of the instructions.   The patient was advised to call back or seek an in-person evaluation if the symptoms worsen or if the condition fails to improve as anticipated.  I provided 8 minutes of non-face-to-face time during this encounter.   Syliva Overman, MD

## 2021-01-02 NOTE — Patient Instructions (Signed)
F/u withDr Allena Katz as before, call if you need to be seen sooner  Azithromycin and tessalon perles are  prescribed for acute bronchitis  Thanks for choosing [facility], we consider it a privelige to serve you.

## 2021-01-07 ENCOUNTER — Encounter: Payer: Self-pay | Admitting: Family Medicine

## 2021-01-07 NOTE — Assessment & Plan Note (Signed)
Azithromycin and tessalon perles are prescribed

## 2021-01-22 DIAGNOSIS — Z23 Encounter for immunization: Secondary | ICD-10-CM | POA: Diagnosis not present

## 2021-02-06 DIAGNOSIS — R059 Cough, unspecified: Secondary | ICD-10-CM | POA: Diagnosis not present

## 2021-02-06 DIAGNOSIS — J111 Influenza due to unidentified influenza virus with other respiratory manifestations: Secondary | ICD-10-CM | POA: Diagnosis not present

## 2021-02-08 DIAGNOSIS — Z1159 Encounter for screening for other viral diseases: Secondary | ICD-10-CM | POA: Diagnosis not present

## 2021-02-08 DIAGNOSIS — I10 Essential (primary) hypertension: Secondary | ICD-10-CM | POA: Diagnosis not present

## 2021-02-08 DIAGNOSIS — R7303 Prediabetes: Secondary | ICD-10-CM | POA: Diagnosis not present

## 2021-02-09 LAB — CMP14+EGFR
ALT: 36 IU/L — ABNORMAL HIGH (ref 0–32)
AST: 27 IU/L (ref 0–40)
Albumin/Globulin Ratio: 2 (ref 1.2–2.2)
Albumin: 4.5 g/dL (ref 3.7–4.7)
Alkaline Phosphatase: 50 IU/L (ref 44–121)
BUN/Creatinine Ratio: 18 (ref 12–28)
BUN: 16 mg/dL (ref 8–27)
Bilirubin Total: 0.2 mg/dL (ref 0.0–1.2)
CO2: 22 mmol/L (ref 20–29)
Calcium: 9.3 mg/dL (ref 8.7–10.3)
Chloride: 104 mmol/L (ref 96–106)
Creatinine, Ser: 0.9 mg/dL (ref 0.57–1.00)
Globulin, Total: 2.2 g/dL (ref 1.5–4.5)
Glucose: 108 mg/dL — ABNORMAL HIGH (ref 70–99)
Potassium: 4.3 mmol/L (ref 3.5–5.2)
Sodium: 140 mmol/L (ref 134–144)
Total Protein: 6.7 g/dL (ref 6.0–8.5)
eGFR: 68 mL/min/{1.73_m2} (ref >59)

## 2021-02-09 LAB — CBC WITH DIFFERENTIAL/PLATELET
Basophils Absolute: 0 10*3/uL (ref 0.0–0.2)
Basos: 1 %
EOS (ABSOLUTE): 0.2 10*3/uL (ref 0.0–0.4)
Eos: 2 %
Hematocrit: 42.6 % (ref 34.0–46.6)
Hemoglobin: 14 g/dL (ref 11.1–15.9)
Immature Grans (Abs): 0 10*3/uL (ref 0.0–0.1)
Immature Granulocytes: 0 %
Lymphocytes Absolute: 1.8 10*3/uL (ref 0.7–3.1)
Lymphs: 26 %
MCH: 28.1 pg (ref 26.6–33.0)
MCHC: 32.9 g/dL (ref 31.5–35.7)
MCV: 86 fL (ref 79–97)
Monocytes Absolute: 0.6 10*3/uL (ref 0.1–0.9)
Monocytes: 8 %
Neutrophils Absolute: 4.3 10*3/uL (ref 1.4–7.0)
Neutrophils: 63 %
Platelets: 295 10*3/uL (ref 150–450)
RBC: 4.98 x10E6/uL (ref 3.77–5.28)
RDW: 13.6 % (ref 11.7–15.4)
WBC: 6.9 10*3/uL (ref 3.4–10.8)

## 2021-02-09 LAB — LIPID PANEL
Chol/HDL Ratio: 3.9 ratio (ref 0.0–4.4)
Cholesterol, Total: 235 mg/dL — ABNORMAL HIGH (ref 100–199)
HDL: 60 mg/dL (ref >39)
LDL Chol Calc (NIH): 145 mg/dL — ABNORMAL HIGH (ref 0–99)
Triglycerides: 171 mg/dL — ABNORMAL HIGH (ref 0–149)
VLDL Cholesterol Cal: 30 mg/dL (ref 5–40)

## 2021-02-09 LAB — HEPATITIS C ANTIBODY: Hep C Virus Ab: <0.1 s/co ratio (ref 0.0–0.9)

## 2021-02-11 ENCOUNTER — Telehealth: Payer: Self-pay

## 2021-02-11 ENCOUNTER — Encounter: Payer: Self-pay | Admitting: Internal Medicine

## 2021-02-11 ENCOUNTER — Other Ambulatory Visit: Payer: Self-pay

## 2021-02-11 ENCOUNTER — Ambulatory Visit: Payer: Medicare PPO | Admitting: Internal Medicine

## 2021-02-11 VITALS — BP 136/80 | HR 97 | Resp 18 | Ht 59.0 in | Wt 148.1 lb

## 2021-02-11 DIAGNOSIS — I10 Essential (primary) hypertension: Secondary | ICD-10-CM | POA: Diagnosis not present

## 2021-02-11 DIAGNOSIS — R7303 Prediabetes: Secondary | ICD-10-CM | POA: Diagnosis not present

## 2021-02-11 DIAGNOSIS — E782 Mixed hyperlipidemia: Secondary | ICD-10-CM

## 2021-02-11 DIAGNOSIS — J209 Acute bronchitis, unspecified: Secondary | ICD-10-CM | POA: Diagnosis not present

## 2021-02-11 MED ORDER — METHYLPREDNISOLONE 4 MG PO TBPK: ORAL_TABLET | ORAL | 0 refills | Status: DC

## 2021-02-11 MED ORDER — ATORVASTATIN CALCIUM 10 MG PO TABS: 10.0000 mg | ORAL_TABLET | Freq: Every day | ORAL | 1 refills | Status: DC

## 2021-02-11 NOTE — Telephone Encounter (Signed)
Robin Bridges from [facility] called saying the Atorvastatin that Dr Allena Katz prescribed is already being prescribed by another provider and that provider directions take 1/2 pill every other day. Asking should the pharmacy fill this medication?  Call back # [phone].

## 2021-02-11 NOTE — Telephone Encounter (Signed)
Pharmacy notified of change with verbal understanding

## 2021-02-11 NOTE — Assessment & Plan Note (Signed)
Lipid profile reviewed On Atorvastatin 5 mg QD, increased to 10 mg QD now

## 2021-02-11 NOTE — Assessment & Plan Note (Signed)
Continue to follow low carb diet Check HbA1C

## 2021-02-11 NOTE — Progress Notes (Signed)
Established Patient Office Visit  Subjective:  Patient ID: Robin Bridges, female    DOB: September 03, 1945  Age: 75 y.o. MRN: [id number]  CC:  Chief Complaint  Patient presents with   Follow-up    4 month follow up pt has cough that started 02-09-21 also coughing up phlegm     HPI Robin Bridges is a 75 y.o. female with past medical history of HTN, HLD, prediabetes, GERD, anxiety and tobacco abuse who presents for f/u of her chronic medical conditions.  HTN: BP is well-controlled. Takes medications regularly. Patient denies headache, dizziness, chest pain, dyspnea or palpitations.  Her vitiligo spots are better with Tacrolimus now.Denies any itching or irritation.   She has been having dry cough especially in the morning.  Denies any dyspnea, fever, chills, nausea or vomiting.  She has quit smoking now. She was treated with Azithromycin and Tessalon for acute bronchitis about 6 weeks ago.  Blood tests were reviewed and discussed with the patient in detail. Lipid profile shows elevated LDL, and she agrees to increase statin dose now.   Past Medical History:  Diagnosis Date   Hypertension     Past Surgical History:  Procedure Laterality Date   ABDOMINAL HYSTERECTOMY     COLONOSCOPY N/A 02/19/2017   Procedure: COLONOSCOPY;  Surgeon: Rogene Houston, MD;  Location: [facility];  Service: Endoscopy;  Laterality: N/A;  830    History reviewed. No pertinent family history.  Social History   Socioeconomic History   Marital status: Single    Spouse name: Not on file   Number of children: Not on file   Years of education: Not on file   Highest education level: Not on file  Occupational History   Not on file  Tobacco Use   Smoking status: Former    Types: Cigars   Smokeless tobacco: Never  Substance and Sexual Activity   Alcohol use: Yes    Alcohol/week: 1.0 standard drink    Types: 1 Cans of beer per week    Comment: some days   Drug use: Yes    Types: Marijuana     Comment: here and there for pain   Sexual activity: Not on file  Other Topics Concern   Not on file  Social History Narrative   Not on file   Social Determinants of Health   Financial Resource Strain: Low Risk    Difficulty of Paying Living Expenses: Not hard at all  Food Insecurity: No Food Insecurity   Worried About Charity fundraiser in the Last Year: Never true   Ran Out of Food in the Last Year: Never true  Transportation Needs: No Transportation Needs   Lack of Transportation (Medical): No   Lack of Transportation (Non-Medical): No  Physical Activity: Insufficiently Active   Days of Exercise per Week: 2 days   Minutes of Exercise per Session: 60 min  Stress: No Stress Concern Present   Feeling of Stress : Not at all  Social Connections: Moderately Isolated   Frequency of Communication with Friends and Family: More than three times a week   Frequency of Social Gatherings with Friends and Family: More than three times a week   Attends Religious Services: More than 4 times per year   Active Member of [employer] or Organizations: No   Attends Archivist Meetings: Never   Marital Status: Widowed  Intimate Partner Violence: Not At Risk   Fear of Current or Ex-Partner: No   Emotionally  Abused: No   Physically Abused: No   Sexually Abused: No    Outpatient Medications Prior to Visit  Medication Sig Dispense Refill   ALPRAZolam (XANAX) 0.5 MG tablet Take 0.5 mg by mouth 2 (two) times daily as needed for anxiety. Take 1 tablet by mouth twice daily as needed for anxiety     amLODipine (NORVASC) 10 MG tablet Take 10 mg by mouth daily.     aspirin EC 81 MG tablet Take 81 mg by mouth daily.     benzonatate (TESSALON PERLES) 100 MG capsule Take 1 capsule (100 mg total) by mouth every 6 (six) hours as needed for cough. 30 capsule 0   hydrochlorothiazide (MICROZIDE) 12.5 MG capsule Take 1 capsule (12.5 mg total) by mouth daily. 90 capsule 1   pantoprazole (PROTONIX) 40 MG tablet  Take 40 mg by mouth daily. Take one tablet by mouth everyday before supper     tacrolimus (PROTOPIC) 0.1 % ointment Apply topically 2 (two) times daily. 100 g 0   terbinafine (LAMISIL) 250 MG tablet Take 1 tablet (250 mg total) by mouth daily. 28 tablet 2   venlafaxine XR (EFFEXOR-XR) 37.5 MG 24 hr capsule Take 37.5 mg by mouth daily with breakfast. Take one capsule by mouth every day     atorvastatin (LIPITOR) 10 MG tablet Take 5 mg by mouth daily.     No facility-administered medications prior to visit.    No Known Allergies  ROS Review of Systems  Constitutional:  Negative for chills and fever.  HENT:  Negative for congestion, sinus pressure, sinus pain and sore throat.   Eyes:  Negative for pain and discharge.  Respiratory:  Positive for cough. Negative for shortness of breath.   Cardiovascular:  Negative for chest pain and palpitations.  Gastrointestinal:  Negative for abdominal pain, constipation, diarrhea, nausea and vomiting.  Endocrine: Negative for polydipsia and polyuria.  Genitourinary:  Negative for dysuria and hematuria.  Musculoskeletal:  Negative for neck pain and neck stiffness.  Skin:  Negative for rash.  Neurological:  Negative for dizziness and weakness.  Psychiatric/Behavioral:  Negative for agitation and behavioral problems.      Objective:    Physical Exam Vitals reviewed.  Constitutional:      General: She is not in acute distress.    Appearance: She is not diaphoretic.  HENT:     Head: Normocephalic and atraumatic.     Nose: Nose normal.     Mouth/Throat:     Mouth: Mucous membranes are moist.  Eyes:     General: No scleral icterus.    Extraocular Movements: Extraocular movements intact.  Cardiovascular:     Rate and Rhythm: Normal rate and regular rhythm.     Pulses: Normal pulses.     Heart sounds: Normal heart sounds. No murmur heard. Pulmonary:     Breath sounds: Normal breath sounds. No wheezing or rales.  Abdominal:     Palpations:  Abdomen is soft.     Tenderness: There is no abdominal tenderness.  Musculoskeletal:     Cervical back: Neck supple. No tenderness.     Right lower leg: No edema.     Left lower leg: No edema.  Skin:    General: Skin is warm.     Findings: No rash.     Comments: Skin tag over neck region  Neurological:     General: No focal deficit present.     Mental Status: She is alert and oriented to person, place, and time.  Sensory: No sensory deficit.     Motor: No weakness.  Psychiatric:        Mood and Affect: Mood normal.        Behavior: Behavior normal.    BP 136/80 (BP Location: Right Arm, Cuff Size: Normal)   Pulse 97   Resp 18   Ht 4' 11"  (1.499 m)   Wt 148 lb 1.3 oz (67.2 kg)   SpO2 94%   BMI 29.91 kg/m  Wt Readings from Last 3 Encounters:  02/11/21 148 lb 1.3 oz (67.2 kg)  10/08/20 153 lb 1.9 oz (69.5 kg)  08/27/20 151 lb 0.6 oz (68.5 kg)    No results found for: TSH Lab Results  Component Value Date   WBC 6.9 02/08/2021   HGB 14.0 02/08/2021   HCT 42.6 02/08/2021   MCV 86 02/08/2021   PLT 295 02/08/2021   Lab Results  Component Value Date   NA 140 02/08/2021   K 4.3 02/08/2021   CO2 22 02/08/2021   GLUCOSE 108 (H) 02/08/2021   BUN 16 02/08/2021   CREATININE 0.90 02/08/2021   BILITOT 0.2 02/08/2021   ALKPHOS 50 02/08/2021   AST 27 02/08/2021   ALT 36 (H) 02/08/2021   PROT 6.7 02/08/2021   ALBUMIN 4.5 02/08/2021   CALCIUM 9.3 02/08/2021   EGFR 68 02/08/2021   Lab Results  Component Value Date   CHOL 235 (H) 02/08/2021   Lab Results  Component Value Date   HDL 60 02/08/2021   Lab Results  Component Value Date   LDLCALC 145 (H) 02/08/2021   Lab Results  Component Value Date   TRIG 171 (H) 02/08/2021   Lab Results  Component Value Date   CHOLHDL 3.9 02/08/2021   Lab Results  Component Value Date   HGBA1C 6.3 10/08/2020   HGBA1C 6.3 10/08/2020      Assessment & Plan:   Problem List Items Addressed This Visit        Cardiovascular and Mediastinum   Benign essential hypertension - Primary    BP Readings from Last 1 Encounters:  02/11/21 136/80  Well-controlled with amlodipine and HCTZ Counseled for compliance with the medications Advised DASH diet and moderate exercise/walking, at least 150 mins/week      Relevant Medications   atorvastatin (LIPITOR) 10 MG tablet     Respiratory   Acute bronchitis    Started Medrol dosepak Continue Mucinex or Robitussin PRN for cough      Relevant Medications   methylPREDNISolone (MEDROL DOSEPAK) 4 MG TBPK tablet     Other   Prediabetes    Continue to follow low carb diet Check HbA1C      Mixed hyperlipidemia    Lipid profile reviewed On Atorvastatin 5 mg QD, increased to 10 mg QD now      Relevant Medications   atorvastatin (LIPITOR) 10 MG tablet    Meds ordered this encounter  Medications   atorvastatin (LIPITOR) 10 MG tablet    Sig: Take 1 tablet (10 mg total) by mouth daily.    Dispense:  90 tablet    Refill:  1   methylPREDNISolone (MEDROL DOSEPAK) 4 MG TBPK tablet    Sig: Take as package instructions.    Dispense:  1 each    Refill:  0    Follow-up: Return in about 4 months (around 06/12/2021) for Annual physical.    Lindell Spar, MD

## 2021-02-11 NOTE — Assessment & Plan Note (Signed)
BP Readings from Last 1 Encounters:  02/11/21 136/80   Well-controlled with amlodipine and HCTZ Counseled for compliance with the medications Advised DASH diet and moderate exercise/walking, at least 150 mins/week

## 2021-02-11 NOTE — Patient Instructions (Signed)
Please start taking Atorvastatin 10 mg instead of 5 mg.  Please continue to follow low carb and low salt diet.

## 2021-02-11 NOTE — Assessment & Plan Note (Signed)
Started Medrol dosepak Continue Mucinex or Robitussin PRN for cough

## 2021-03-15 ENCOUNTER — Encounter: Payer: Self-pay | Admitting: Internal Medicine

## 2021-03-15 ENCOUNTER — Ambulatory Visit: Payer: Medicare PPO | Admitting: Internal Medicine

## 2021-03-15 ENCOUNTER — Other Ambulatory Visit: Payer: Self-pay

## 2021-03-15 DIAGNOSIS — J209 Acute bronchitis, unspecified: Secondary | ICD-10-CM

## 2021-03-15 MED ORDER — METHYLPREDNISOLONE 4 MG PO TBPK: ORAL_TABLET | ORAL | 0 refills | Status: DC

## 2021-03-15 NOTE — Assessment & Plan Note (Signed)
Started Medrol dosepak Continue Mucinex or Robitussin PRN for cough Tessalon as needed for cough Advised to contact if persistent or worsening symptoms

## 2021-03-15 NOTE — Progress Notes (Signed)
Virtual Visit via Telephone Note   This visit type was conducted due to national recommendations for restrictions regarding the COVID-19 Pandemic (e.g. social distancing) in an effort to limit this patient's exposure and mitigate transmission in our community.  Due to her co-morbid illnesses, this patient is at least at moderate risk for complications without adequate follow up.  This format is felt to be most appropriate for this patient at this time.  The patient did not have access to video technology/had technical difficulties with video requiring transitioning to audio format only (telephone).  All issues noted in this document were discussed and addressed.  No physical exam could be performed with this format.   Evaluation Performed:  Follow-up visit  Date:  03/15/2021   ID:  Robin Bridges, DOB 11-05-1945, MRN [id number]  Patient Location: Home Provider Location: Office/Clinic  Participants: Patient Location of Patient: Home Location of Provider: Telehealth Consent was obtain for visit to be over via telehealth. I verified that I am speaking with the correct person using two identifiers.  PCP:  Anabel Halon, MD   Chief Complaint: Cough  History of Present Illness:    Robin Bridges is a 76 y.o. female who has a televisit for complaint of cough and mild dyspnea since last 2 days.  She denies any fever or chills currently.  Denies any recent sick contacts.  Her home COVID test was negative.  She denies any chest pain or palpitations currently.  The patient does not have symptoms concerning for COVID-19 infection (fever, chills, cough, or new shortness of breath).   Past Medical, Surgical, Social History, Allergies, and Medications have been Reviewed.  Past Medical History:  Diagnosis Date   Hypertension    Past Surgical History:  Procedure Laterality Date   ABDOMINAL HYSTERECTOMY     COLONOSCOPY N/A 02/19/2017   Procedure: COLONOSCOPY;  Surgeon: Malissa Hippo, MD;   Location: [facility];  Service: Endoscopy;  Laterality: N/A;  830     Current Meds  Medication Sig   ALPRAZolam (XANAX) 0.5 MG tablet Take 0.5 mg by mouth 2 (two) times daily as needed for anxiety. Take 1 tablet by mouth twice daily as needed for anxiety   amLODipine (NORVASC) 10 MG tablet Take 10 mg by mouth daily.   aspirin EC 81 MG tablet Take 81 mg by mouth daily.   atorvastatin (LIPITOR) 10 MG tablet Take 1 tablet (10 mg total) by mouth daily.   benzonatate (TESSALON PERLES) 100 MG capsule Take 1 capsule (100 mg total) by mouth every 6 (six) hours as needed for cough.   hydrochlorothiazide (MICROZIDE) 12.5 MG capsule Take 1 capsule (12.5 mg total) by mouth daily.   pantoprazole (PROTONIX) 40 MG tablet Take 40 mg by mouth daily. Take one tablet by mouth everyday before supper   tacrolimus (PROTOPIC) 0.1 % ointment Apply topically 2 (two) times daily.   terbinafine (LAMISIL) 250 MG tablet Take 1 tablet (250 mg total) by mouth daily.   venlafaxine XR (EFFEXOR-XR) 37.5 MG 24 hr capsule Take 37.5 mg by mouth daily with breakfast. Take one capsule by mouth every day   [DISCONTINUED] methylPREDNISolone (MEDROL DOSEPAK) 4 MG TBPK tablet Take as package instructions.     Allergies:   Patient has no known allergies.   ROS:   Please see the history of present illness.     All other systems reviewed and are negative.   Labs/Other Tests and Data Reviewed:    Recent Labs:  02/08/2021: ALT 36; BUN 16; Creatinine, Ser 0.90; Hemoglobin 14.0; Platelets 295; Potassium 4.3; Sodium 140   Recent Lipid Panel Lab Results  Component Value Date/Time   CHOL 235 (H) 02/08/2021 10:26 AM   TRIG 171 (H) 02/08/2021 10:26 AM   HDL 60 02/08/2021 10:26 AM   CHOLHDL 3.9 02/08/2021 10:26 AM   LDLCALC 145 (H) 02/08/2021 10:26 AM    Wt Readings from Last 3 Encounters:  02/11/21 148 lb 1.3 oz (67.2 kg)  10/08/20 153 lb 1.9 oz (69.5 kg)  08/27/20 151 lb 0.6 oz (68.5 kg)     ASSESSMENT & PLAN:    Acute  bronchitis Started Medrol dosepak Continue Mucinex or Robitussin PRN for cough Tessalon as needed for cough Advised to contact if persistent or worsening symptoms   Time:   Today, I have spent 9 minutes reviewing the chart, including problem list, medications, and with the patient with telehealth technology discussing the above problems.   Medication Adjustments/Labs and Tests Ordered: Current medicines are reviewed at length with the patient today.  Concerns regarding medicines are outlined above.   Tests Ordered: No orders of the defined types were placed in this encounter.   Medication Changes: Meds ordered this encounter  Medications   methylPREDNISolone (MEDROL DOSEPAK) 4 MG TBPK tablet    Sig: Take as package instructions.    Dispense:  1 each    Refill:  0     Note: This dictation was prepared with Dragon dictation along with smaller phrase technology. Similar sounding words can be transcribed inadequately or may not be corrected upon review. Any transcriptional errors that result from this process are unintentional.      Disposition:  Follow up  Signed, Anabel Halon, MD  03/15/2021 11:34 AM     [facility]

## 2021-03-25 ENCOUNTER — Other Ambulatory Visit: Payer: Self-pay | Admitting: Internal Medicine

## 2021-04-23 ENCOUNTER — Encounter: Payer: Self-pay | Admitting: Internal Medicine

## 2021-04-23 ENCOUNTER — Ambulatory Visit: Payer: Medicare PPO | Admitting: Internal Medicine

## 2021-04-23 ENCOUNTER — Other Ambulatory Visit: Payer: Self-pay

## 2021-04-23 ENCOUNTER — Encounter (INDEPENDENT_AMBULATORY_CARE_PROVIDER_SITE_OTHER): Payer: Self-pay

## 2021-04-23 VITALS — BP 118/78 | HR 92 | Resp 18 | Ht 59.0 in | Wt 152.0 lb

## 2021-04-23 DIAGNOSIS — J441 Chronic obstructive pulmonary disease with (acute) exacerbation: Secondary | ICD-10-CM | POA: Diagnosis not present

## 2021-04-23 DIAGNOSIS — J449 Chronic obstructive pulmonary disease, unspecified: Secondary | ICD-10-CM | POA: Insufficient documentation

## 2021-04-23 DIAGNOSIS — J309 Allergic rhinitis, unspecified: Secondary | ICD-10-CM | POA: Diagnosis not present

## 2021-04-23 DIAGNOSIS — Z1231 Encounter for screening mammogram for malignant neoplasm of breast: Secondary | ICD-10-CM

## 2021-04-23 HISTORY — DX: Chronic obstructive pulmonary disease, unspecified: J44.9

## 2021-04-23 MED ORDER — METHYLPREDNISOLONE 4 MG PO TBPK: ORAL_TABLET | ORAL | 0 refills | Status: DC

## 2021-04-23 MED ORDER — BREZTRI AEROSPHERE 160-9-4.8 MCG/ACT IN AERO: 2.0000 | INHALATION_SPRAY | Freq: Two times a day (BID) | RESPIRATORY_TRACT | 11 refills | Status: DC

## 2021-04-23 MED ORDER — AZITHROMYCIN 250 MG PO TABS: ORAL_TABLET | ORAL | 0 refills | Status: AC

## 2021-04-23 MED ORDER — BENZONATATE 100 MG PO CAPS: 100.0000 mg | ORAL_CAPSULE | Freq: Two times a day (BID) | ORAL | 0 refills | Status: DC | PRN

## 2021-04-23 MED ORDER — ALBUTEROL SULFATE HFA 108 (90 BASE) MCG/ACT IN AERS
2.0000 | INHALATION_SPRAY | Freq: Four times a day (QID) | RESPIRATORY_TRACT | 4 refills | Status: DC | PRN
Start: 2021-04-23 — End: 2024-01-12

## 2021-04-23 MED ORDER — METHYLPREDNISOLONE SODIUM SUCC 125 MG IJ SOLR
125.0000 mg | Freq: Once | INTRAMUSCULAR | Status: AC
  Administered 2021-04-23: 125 mg via INTRAMUSCULAR

## 2021-04-23 NOTE — Assessment & Plan Note (Signed)
Symptoms consistent with allergies Continue Zyrtec, if persistent symptoms, will change or add another agent Flonase Nasal saline spray PRN 

## 2021-04-23 NOTE — Assessment & Plan Note (Addendum)
Solu-Medrol 125 mg IM given in the office today Started Medrol dose pack Prescribed azithromycin for possible bacterial coverage Tessalon as needed for cough Started Breztri as maintenance inhaler as she has been using albuterol more frequently Refilled albuterol inhaler, advised to use it only as needed for dyspnea/wheezing Check CT chest

## 2021-04-23 NOTE — Patient Instructions (Signed)
Please start using Breztri as prescribed.  Please use Albuterol inhaler as prescribed for shortness of breath or wheezing.  Please start taking Azithromycin and Prednisone as prescribed.  You are being scheduled to get CT chest done at [facility].

## 2021-04-23 NOTE — Progress Notes (Signed)
Established Patient Office Visit  Subjective:  Patient ID: Robin Bridges, female    DOB: [birth year]  Age: 76 y.o. MRN: [id number]  CC:  Chief Complaint  Patient presents with   Cough    Pt has had cough congestion wheezing in chest cant quit coughing runny nose sob since 04-19-21 did at home covid test 04-21-21    HPI Robin Bridges is a 76 y.o. female with past medical history of HTN, HLD, COPD, prediabetes, GERD, anxiety and tobacco abuse who presents for f/u of her chronic medical conditions.  She complains of chronic cough, which has been worse for the last 3 days.  She also reports dyspnea and wheezing, which are intermittent.  She has been using albuterol neb with some relief of her dyspnea.  Her home COVID test was negative 2 days ago.  She has had 2 episodes of acute bronchitis in the last 2 months.  Of note, she has at least 30-pack-year smoking history, but has quit about 2 years ago.  She currently denies any fever or chills.  She also reports having nasal congestion and postnasal drip, but denies sinus pain.   Past Medical History:  Diagnosis Date   Hypertension     Past Surgical History:  Procedure Laterality Date   ABDOMINAL HYSTERECTOMY     COLONOSCOPY N/A 02/19/2017   Procedure: COLONOSCOPY;  Surgeon: Rogene Houston, MD;  Location: [facility];  Service: Endoscopy;  Laterality: N/A;  830    History reviewed. No pertinent family history.  Social History   Socioeconomic History   Marital status: Single    Spouse name: Not on file   Number of children: Not on file   Years of education: Not on file   Highest education level: Not on file  Occupational History   Not on file  Tobacco Use   Smoking status: Former    Types: Cigars   Smokeless tobacco: Never  Substance and Sexual Activity   Alcohol use: Yes    Alcohol/week: 1.0 standard drink    Types: 1 Cans of beer per week    Comment: some days   Drug use: Yes    Types: Marijuana    Comment: here and  there for pain   Sexual activity: Not on file  Other Topics Concern   Not on file  Social History Narrative   Not on file   Social Determinants of Health   Financial Resource Strain: Low Risk    Difficulty of Paying Living Expenses: Not hard at all  Food Insecurity: No Food Insecurity   Worried About Charity fundraiser in the Last Year: Never true   Ran Out of Food in the Last Year: Never true  Transportation Needs: No Transportation Needs   Lack of Transportation (Medical): No   Lack of Transportation (Non-Medical): No  Physical Activity: Insufficiently Active   Days of Exercise per Week: 2 days   Minutes of Exercise per Session: 60 min  Stress: No Stress Concern Present   Feeling of Stress : Not at all  Social Connections: Moderately Isolated   Frequency of Communication with Friends and Family: More than three times a week   Frequency of Social Gatherings with Friends and Family: More than three times a week   Attends Religious Services: More than 4 times per year   Active Member of [employer] or Organizations: No   Attends Archivist Meetings: Never   Marital Status: Widowed  Intimate Partner Violence: Not  At Risk   Fear of Current or Ex-Partner: No   Emotionally Abused: No   Physically Abused: No   Sexually Abused: No    Outpatient Medications Prior to Visit  Medication Sig Dispense Refill   ALPRAZolam (XANAX) 0.5 MG tablet Take 0.5 mg by mouth 2 (two) times daily as needed for anxiety. Take 1 tablet by mouth twice daily as needed for anxiety     amLODipine (NORVASC) 10 MG tablet Take 10 mg by mouth daily.     aspirin EC 81 MG tablet Take 81 mg by mouth daily.     atorvastatin (LIPITOR) 10 MG tablet Take 1 tablet (10 mg total) by mouth daily. 90 tablet 1   hydrochlorothiazide (MICROZIDE) 12.5 MG capsule TAKE ONE CAPSULE BY MOUTH EVERY DAY 90 capsule 1   pantoprazole (PROTONIX) 40 MG tablet Take 40 mg by mouth daily. Take one tablet by mouth everyday before supper      tacrolimus (PROTOPIC) 0.1 % ointment Apply topically 2 (two) times daily. 100 g 0   venlafaxine XR (EFFEXOR-XR) 37.5 MG 24 hr capsule Take 37.5 mg by mouth daily with breakfast. Take one capsule by mouth every day     benzonatate (TESSALON PERLES) 100 MG capsule Take 1 capsule (100 mg total) by mouth every 6 (six) hours as needed for cough. 30 capsule 0   methylPREDNISolone (MEDROL DOSEPAK) 4 MG TBPK tablet Take as package instructions. 1 each 0   terbinafine (LAMISIL) 250 MG tablet Take 1 tablet (250 mg total) by mouth daily. 28 tablet 2   No facility-administered medications prior to visit.    No Known Allergies  ROS Review of Systems  Constitutional:  Negative for chills and fever.  HENT:  Positive for congestion and postnasal drip. Negative for sinus pain and sore throat.   Eyes:  Negative for pain and discharge.  Respiratory:  Positive for cough, shortness of breath and wheezing.   Cardiovascular:  Negative for chest pain and palpitations.  Gastrointestinal:  Negative for abdominal pain, constipation, diarrhea, nausea and vomiting.  Endocrine: Negative for polydipsia and polyuria.  Genitourinary:  Negative for dysuria and hematuria.  Musculoskeletal:  Negative for neck pain and neck stiffness.  Skin:  Negative for rash.  Neurological:  Negative for dizziness and weakness.  Psychiatric/Behavioral:  Negative for agitation and behavioral problems.      Objective:    Physical Exam Vitals reviewed.  Constitutional:      General: She is not in acute distress.    Appearance: She is not diaphoretic.  HENT:     Head: Normocephalic and atraumatic.     Nose: Nose normal.     Mouth/Throat:     Mouth: Mucous membranes are moist.  Eyes:     General: No scleral icterus.    Extraocular Movements: Extraocular movements intact.  Cardiovascular:     Rate and Rhythm: Normal rate and regular rhythm.     Pulses: Normal pulses.     Heart sounds: Normal heart sounds. No murmur  heard. Pulmonary:     Breath sounds: Wheezing (B/l diffuse) present. No rales.  Musculoskeletal:     Cervical back: Neck supple. No tenderness.     Right lower leg: No edema.     Left lower leg: No edema.  Skin:    General: Skin is warm.     Findings: No rash.     Comments: Skin tag over neck region  Neurological:     General: No focal deficit present.  Mental Status: She is alert and oriented to person, place, and time.  Psychiatric:        Mood and Affect: Mood normal.        Behavior: Behavior normal.    BP 118/78 (BP Location: Right Arm, Patient Position: Sitting, Cuff Size: Normal)    Pulse 92    Resp 18    Ht 4' 11"  (1.499 m)    Wt 152 lb (68.9 kg)    SpO2 93%    BMI 30.70 kg/m  Wt Readings from Last 3 Encounters:  04/23/21 152 lb (68.9 kg)  02/11/21 148 lb 1.3 oz (67.2 kg)  10/08/20 153 lb 1.9 oz (69.5 kg)    No results found for: TSH Lab Results  Component Value Date   WBC 6.9 02/08/2021   HGB 14.0 02/08/2021   HCT 42.6 02/08/2021   MCV 86 02/08/2021   PLT 295 02/08/2021   Lab Results  Component Value Date   NA 140 02/08/2021   K 4.3 02/08/2021   CO2 22 02/08/2021   GLUCOSE 108 (H) 02/08/2021   BUN 16 02/08/2021   CREATININE 0.90 02/08/2021   BILITOT 0.2 02/08/2021   ALKPHOS 50 02/08/2021   AST 27 02/08/2021   ALT 36 (H) 02/08/2021   PROT 6.7 02/08/2021   ALBUMIN 4.5 02/08/2021   CALCIUM 9.3 02/08/2021   EGFR 68 02/08/2021   Lab Results  Component Value Date   CHOL 235 (H) 02/08/2021   Lab Results  Component Value Date   HDL 60 02/08/2021   Lab Results  Component Value Date   LDLCALC 145 (H) 02/08/2021   Lab Results  Component Value Date   TRIG 171 (H) 02/08/2021   Lab Results  Component Value Date   CHOLHDL 3.9 02/08/2021   Lab Results  Component Value Date   HGBA1C 6.3 10/08/2020   HGBA1C 6.3 10/08/2020      Assessment & Plan:   Problem List Items Addressed This Visit       Respiratory   Allergic sinusitis    Symptoms  consistent with allergies Continue Zyrtec, if persistent symptoms, will change or add another agent Flonase Nasal saline spray PRN      Relevant Medications   azithromycin (ZITHROMAX) 250 MG tablet   methylPREDNISolone (MEDROL DOSEPAK) 4 MG TBPK tablet   benzonatate (TESSALON PERLES) 100 MG capsule   Chronic obstructive pulmonary disease with acute exacerbation (HCC) - Primary    Solu-Medrol 125 mg IM given in the office today Started Medrol dose pack Prescribed azithromycin for possible bacterial coverage Tessalon as needed for cough Started Breztri as maintenance inhaler as she has been using albuterol more frequently Refilled albuterol inhaler, advised to use it only as needed for dyspnea/wheezing Check CT chest      Relevant Medications   albuterol (VENTOLIN HFA) 108 (90 Base) MCG/ACT inhaler   azithromycin (ZITHROMAX) 250 MG tablet   Budeson-Glycopyrrol-Formoterol (BREZTRI AEROSPHERE) 160-9-4.8 MCG/ACT AERO   methylPREDNISolone (MEDROL DOSEPAK) 4 MG TBPK tablet   benzonatate (TESSALON PERLES) 100 MG capsule   Other Relevant Orders   CT Chest Wo Contrast   Other Visit Diagnoses     Screening mammogram for breast cancer       Relevant Orders   MM 3D SCREEN BREAST BILATERAL       Meds ordered this encounter  Medications   albuterol (VENTOLIN HFA) 108 (90 Base) MCG/ACT inhaler    Sig: Inhale 2 puffs into the lungs every 6 (six) hours as needed for wheezing  or shortness of breath.    Dispense:  8 g    Refill:  4    Okay to substitute to generic/formulary Albuterol.   azithromycin (ZITHROMAX) 250 MG tablet    Sig: Take 2 tablets on day 1, then 1 tablet daily on days 2 through 5    Dispense:  6 tablet    Refill:  0   Budeson-Glycopyrrol-Formoterol (BREZTRI AEROSPHERE) 160-9-4.8 MCG/ACT AERO    Sig: Inhale 2 puffs into the lungs 2 (two) times daily.    Dispense:  10.7 g    Refill:  11   methylPREDNISolone (MEDROL DOSEPAK) 4 MG TBPK tablet    Sig: Take as package  instructions.    Dispense:  1 each    Refill:  0   benzonatate (TESSALON PERLES) 100 MG capsule    Sig: Take 1 capsule (100 mg total) by mouth 2 (two) times daily as needed for cough.    Dispense:  30 capsule    Refill:  0   methylPREDNISolone sodium succinate (SOLU-MEDROL) 125 mg/2 mL injection 125 mg    Follow-up: Return if symptoms worsen or fail to improve.    Lindell Spar, MD

## 2021-05-14 ENCOUNTER — Ambulatory Visit (HOSPITAL_COMMUNITY)
Admission: RE | Admit: 2021-05-14 | Discharge: 2021-05-14 | Disposition: A | Discharge status: Home / Self Care | Payer: Medicare PPO | Source: Ambulatory Visit | Attending: Internal Medicine | Admitting: Internal Medicine

## 2021-05-14 ENCOUNTER — Other Ambulatory Visit: Payer: Self-pay

## 2021-05-14 DIAGNOSIS — R0602 Shortness of breath: Secondary | ICD-10-CM | POA: Diagnosis not present

## 2021-05-14 DIAGNOSIS — R053 Chronic cough: Secondary | ICD-10-CM | POA: Diagnosis not present

## 2021-05-14 DIAGNOSIS — J441 Chronic obstructive pulmonary disease with (acute) exacerbation: Secondary | ICD-10-CM | POA: Diagnosis not present

## 2021-05-15 ENCOUNTER — Other Ambulatory Visit: Payer: Self-pay | Admitting: *Deleted

## 2021-05-15 DIAGNOSIS — R0602 Shortness of breath: Secondary | ICD-10-CM

## 2021-06-11 ENCOUNTER — Ambulatory Visit (INDEPENDENT_AMBULATORY_CARE_PROVIDER_SITE_OTHER): Payer: Medicare PPO | Admitting: Internal Medicine

## 2021-06-11 ENCOUNTER — Encounter: Payer: Self-pay | Admitting: Internal Medicine

## 2021-06-11 VITALS — BP 122/78 | HR 78 | Resp 18 | Ht 59.0 in | Wt 159.2 lb

## 2021-06-11 DIAGNOSIS — J309 Allergic rhinitis, unspecified: Secondary | ICD-10-CM | POA: Diagnosis not present

## 2021-06-11 DIAGNOSIS — K219 Gastro-esophageal reflux disease without esophagitis: Secondary | ICD-10-CM

## 2021-06-11 DIAGNOSIS — E782 Mixed hyperlipidemia: Secondary | ICD-10-CM

## 2021-06-11 DIAGNOSIS — Z0001 Encounter for general adult medical examination with abnormal findings: Secondary | ICD-10-CM

## 2021-06-11 DIAGNOSIS — R7303 Prediabetes: Secondary | ICD-10-CM

## 2021-06-11 DIAGNOSIS — I10 Essential (primary) hypertension: Secondary | ICD-10-CM | POA: Diagnosis not present

## 2021-06-11 DIAGNOSIS — E559 Vitamin D deficiency, unspecified: Secondary | ICD-10-CM

## 2021-06-11 DIAGNOSIS — F411 Generalized anxiety disorder: Secondary | ICD-10-CM

## 2021-06-11 DIAGNOSIS — J441 Chronic obstructive pulmonary disease with (acute) exacerbation: Secondary | ICD-10-CM | POA: Diagnosis not present

## 2021-06-11 MED ORDER — VENLAFAXINE HCL ER 37.5 MG PO CP24: 37.5000 mg | ORAL_CAPSULE | Freq: Every day | ORAL | 3 refills | Status: DC

## 2021-06-11 MED ORDER — MONTELUKAST SODIUM 10 MG PO TABS: 10.0000 mg | ORAL_TABLET | Freq: Every day | ORAL | 3 refills | Status: DC

## 2021-06-11 MED ORDER — AMLODIPINE BESYLATE 10 MG PO TABS: 10.0000 mg | ORAL_TABLET | Freq: Every day | ORAL | 3 refills | Status: DC

## 2021-06-11 MED ORDER — PANTOPRAZOLE SODIUM 40 MG PO TBEC: 40.0000 mg | DELAYED_RELEASE_TABLET | Freq: Every day | ORAL | 3 refills | Status: DC

## 2021-06-11 NOTE — Assessment & Plan Note (Addendum)
Well-controlled with Effexor and Xanax ?

## 2021-06-11 NOTE — Patient Instructions (Signed)
Please start taking Singulair for allergies. Continue using Flonase for allergies. ? ?Please continue taking other medications as prescribed. ? ?Please use Debrox ear drops for ear wax. ? ?Please continue to follow low carb diet and perform moderate exercise/walking at least 150 mins/week. ?

## 2021-06-11 NOTE — Assessment & Plan Note (Signed)
On Pantoprazole 

## 2021-06-11 NOTE — Assessment & Plan Note (Signed)
Well controlled with Breztri and as needed albuterol now 

## 2021-06-11 NOTE — Assessment & Plan Note (Signed)
Symptoms consistent with allergies ?Continue Flonase ?Added Singulair for allergies ?Nasal saline spray PRN ?

## 2021-06-11 NOTE — Assessment & Plan Note (Signed)
Physical exam as documented. ?Fasting blood tests ordered. ?Advised to get PCV 20 once her sinus symptoms improve. ?Advised to get Shingrix vaccine at local pharmacy. ?

## 2021-06-11 NOTE — Progress Notes (Signed)
? ?Established Patient Office Visit ? ?Subjective:  ?Patient ID: Robin Bridges, female    DOB: Sep 10, 1945  Age: 76 y.o. MRN: [id number] ? ?CC:  ?Chief Complaint  ?Patient presents with  ? Annual Exam  ?  Annual exam pt has cough congestion runny nose sore throat and right ear pain this started 06-08-21 she did take a covid test at home was neg   ? ? ?HPI ?Robin Bridges is a 77 y.o. female with past medical history of HTN, HLD, COPD, prediabetes, GERD, anxiety and tobacco abuse who presents for annual physical. ? ?Sinusitis: She complains of chronic nasal congestion, postnasal drip and cough.  She attributes her symptoms to allergies.  She has been using Flonase with no relief.  She currently denies any fever, chills, dyspnea or wheezing.  She has been doing better with [facility] and as needed albuterol now. ? ?GAD: She has been taking Effexor and as needed Xanax.  Denies any anhedonia, SI or HI currently. ? ?BP is well-controlled. Takes medications regularly. Patient denies headache, dizziness, chest pain, dyspnea or palpitations. ? ?Past Medical History:  ?Diagnosis Date  ? Hypertension   ? ? ?Past Surgical History:  ?Procedure Laterality Date  ? ABDOMINAL HYSTERECTOMY    ? COLONOSCOPY N/A 02/19/2017  ? Procedure: COLONOSCOPY;  Surgeon: Rogene Houston, MD;  Location: [facility];  Service: Endoscopy;  Laterality: N/A;  830  ? ? ?History reviewed. No pertinent family history. ? ?Social History  ? ?Socioeconomic History  ? Marital status: Single  ?  Spouse name: Not on file  ? Number of children: Not on file  ? Years of education: Not on file  ? Highest education level: Not on file  ?Occupational History  ? Not on file  ?Tobacco Use  ? Smoking status: Former  ?  Types: Cigars  ? Smokeless tobacco: Never  ?Substance and Sexual Activity  ? Alcohol use: Yes  ?  Alcohol/week: 1.0 standard drink  ?  Types: 1 Cans of beer per week  ?  Comment: some days  ? Drug use: Yes  ?  Types: Marijuana  ?  Comment: here and there for  pain  ? Sexual activity: Not on file  ?Other Topics Concern  ? Not on file  ?Social History Narrative  ? Not on file  ? ?Social Determinants of Health  ? ?Financial Resource Strain: Low Risk   ? Difficulty of Paying Living Expenses: Not hard at all  ?Food Insecurity: No Food Insecurity  ? Worried About Charity fundraiser in the Last Year: Never true  ? Ran Out of Food in the Last Year: Never true  ?Transportation Needs: No Transportation Needs  ? Lack of Transportation (Medical): No  ? Lack of Transportation (Non-Medical): No  ?Physical Activity: Insufficiently Active  ? Days of Exercise per Week: 2 days  ? Minutes of Exercise per Session: 60 min  ?Stress: No Stress Concern Present  ? Feeling of Stress : Not at all  ?Social Connections: Moderately Isolated  ? Frequency of Communication with Friends and Family: More than three times a week  ? Frequency of Social Gatherings with Friends and Family: More than three times a week  ? Attends Religious Services: More than 4 times per year  ? Active Member of Clubs or Organizations: No  ? Attends Archivist Meetings: Never  ? Marital Status: Widowed  ?Intimate Partner Violence: Not At Risk  ? Fear of Current or Ex-Partner: No  ? Emotionally  Abused: No  ? Physically Abused: No  ? Sexually Abused: No  ? ? ?Outpatient Medications Prior to Visit  ?Medication Sig Dispense Refill  ? albuterol (VENTOLIN HFA) 108 (90 Base) MCG/ACT inhaler Inhale 2 puffs into the lungs every 6 (six) hours as needed for wheezing or shortness of breath. 8 g 4  ? ALPRAZolam (XANAX) 0.5 MG tablet Take 0.5 mg by mouth 2 (two) times daily as needed for anxiety. Take 1 tablet by mouth twice daily as needed for anxiety    ? aspirin EC 81 MG tablet Take 81 mg by mouth daily.    ? atorvastatin (LIPITOR) 10 MG tablet Take 1 tablet (10 mg total) by mouth daily. 90 tablet 1  ? Budeson-Glycopyrrol-Formoterol (BREZTRI AEROSPHERE) 160-9-4.8 MCG/ACT AERO Inhale 2 puffs into the lungs 2 (two) times daily.  10.7 g 11  ? hydrochlorothiazide (MICROZIDE) 12.5 MG capsule TAKE ONE CAPSULE BY MOUTH EVERY DAY 90 capsule 1  ? tacrolimus (PROTOPIC) 0.1 % ointment Apply topically 2 (two) times daily. 100 g 0  ? amLODipine (NORVASC) 10 MG tablet Take 10 mg by mouth daily.    ? pantoprazole (PROTONIX) 40 MG tablet Take 40 mg by mouth daily. Take one tablet by mouth everyday before supper    ? venlafaxine XR (EFFEXOR-XR) 37.5 MG 24 hr capsule Take 37.5 mg by mouth daily with breakfast. Take one capsule by mouth every day    ? benzonatate (TESSALON PERLES) 100 MG capsule Take 1 capsule (100 mg total) by mouth 2 (two) times daily as needed for cough. 30 capsule 0  ? methylPREDNISolone (MEDROL DOSEPAK) 4 MG TBPK tablet Take as package instructions. 1 each 0  ? ?No facility-administered medications prior to visit.  ? ? ?No Known Allergies ? ?ROS ?Review of Systems  ?Constitutional:  Negative for chills and fever.  ?HENT:  Positive for congestion and postnasal drip. Negative for sinus pain and sore throat.   ?Eyes:  Negative for pain and discharge.  ?Respiratory:  Positive for cough. Negative for shortness of breath and wheezing.   ?Cardiovascular:  Negative for chest pain and palpitations.  ?Gastrointestinal:  Negative for abdominal pain, diarrhea, nausea and vomiting.  ?Endocrine: Negative for polydipsia and polyuria.  ?Genitourinary:  Negative for dysuria and hematuria.  ?Musculoskeletal:  Negative for neck pain and neck stiffness.  ?Skin:  Negative for rash.  ?Neurological:  Negative for dizziness and weakness.  ?Psychiatric/Behavioral:  Negative for agitation and behavioral problems.   ? ?  ?Objective:  ?  ?Physical Exam ?Vitals reviewed.  ?Constitutional:   ?   General: She is not in acute distress. ?   Appearance: She is not diaphoretic.  ?HENT:  ?   Head: Normocephalic and atraumatic.  ?   Nose: Congestion present.  ?   Mouth/Throat:  ?   Mouth: Mucous membranes are moist.  ?Eyes:  ?   General: No scleral icterus. ?    Extraocular Movements: Extraocular movements intact.  ?Cardiovascular:  ?   Rate and Rhythm: Normal rate and regular rhythm.  ?   Pulses: Normal pulses.  ?   Heart sounds: Normal heart sounds. No murmur heard. ?Pulmonary:  ?   Breath sounds: Normal breath sounds. No wheezing or rales.  ?Abdominal:  ?   Palpations: Abdomen is soft.  ?   Tenderness: There is no abdominal tenderness.  ?Musculoskeletal:  ?   Cervical back: Neck supple. No tenderness.  ?   Right lower leg: No edema.  ?   Left lower leg:  No edema.  ?Skin: ?   General: Skin is warm.  ?   Findings: No rash.  ?   Comments: Skin tag over neck region  ?Neurological:  ?   General: No focal deficit present.  ?   Mental Status: She is alert and oriented to person, place, and time.  ?   Cranial Nerves: No cranial nerve deficit.  ?   Sensory: No sensory deficit.  ?   Motor: No weakness.  ?Psychiatric:     ?   Mood and Affect: Mood normal.     ?   Behavior: Behavior normal.  ? ? ?BP 122/78 (BP Location: Right Arm, Patient Position: Sitting, Cuff Size: Normal)   Pulse 78   Resp 18   Ht 4' 11"  (1.499 m)   Wt 159 lb 3.2 oz (72.2 kg)   SpO2 95%   BMI 32.15 kg/m?  ?Wt Readings from Last 3 Encounters:  ?06/11/21 159 lb 3.2 oz (72.2 kg)  ?04/23/21 152 lb (68.9 kg)  ?02/11/21 148 lb 1.3 oz (67.2 kg)  ? ? ?No results found for: TSH ?Lab Results  ?Component Value Date  ? WBC 6.9 02/08/2021  ? HGB 14.0 02/08/2021  ? HCT 42.6 02/08/2021  ? MCV 86 02/08/2021  ? PLT 295 02/08/2021  ? ?Lab Results  ?Component Value Date  ? NA 140 02/08/2021  ? K 4.3 02/08/2021  ? CO2 22 02/08/2021  ? GLUCOSE 108 (H) 02/08/2021  ? BUN 16 02/08/2021  ? CREATININE 0.90 02/08/2021  ? BILITOT 0.2 02/08/2021  ? ALKPHOS 50 02/08/2021  ? AST 27 02/08/2021  ? ALT 36 (H) 02/08/2021  ? PROT 6.7 02/08/2021  ? ALBUMIN 4.5 02/08/2021  ? CALCIUM 9.3 02/08/2021  ? EGFR 68 02/08/2021  ? ?Lab Results  ?Component Value Date  ? CHOL 235 (H) 02/08/2021  ? ?Lab Results  ?Component Value Date  ? HDL 60 02/08/2021   ? ?Lab Results  ?Component Value Date  ? LDLCALC 145 (H) 02/08/2021  ? ?Lab Results  ?Component Value Date  ? TRIG 171 (H) 02/08/2021  ? ?Lab Results  ?Component Value Date  ? CHOLHDL 3.9 02/08/2021  ? ?Lab Results

## 2021-06-11 NOTE — Assessment & Plan Note (Signed)
BP Readings from Last 1 Encounters:  ?06/11/21 122/78  ? ?Well-controlled with amlodipine and HCTZ ?Counseled for compliance with the medications ?Advised DASH diet and moderate exercise/walking, at least 150 mins/week ?

## 2021-06-27 ENCOUNTER — Ambulatory Visit: Payer: Medicare PPO | Admitting: Internal Medicine

## 2021-06-27 ENCOUNTER — Encounter: Payer: Self-pay | Admitting: Internal Medicine

## 2021-06-27 DIAGNOSIS — J449 Chronic obstructive pulmonary disease, unspecified: Secondary | ICD-10-CM

## 2021-06-27 DIAGNOSIS — J4489 Other specified chronic obstructive pulmonary disease: Secondary | ICD-10-CM | POA: Insufficient documentation

## 2021-06-27 MED ORDER — BREZTRI AEROSPHERE 160-9-4.8 MCG/ACT IN AERO
2.0000 | INHALATION_SPRAY | Freq: Two times a day (BID) | RESPIRATORY_TRACT | 0 refills | Status: DC
Start: 2021-06-27 — End: 2021-09-12

## 2021-06-27 NOTE — Assessment & Plan Note (Addendum)
Onset around 2020 p husband's death with 20 lb wt gain  ?- stopped smoking 10/2020 at wt 151  ?- 06/27/2021  After extensive coaching inhaler device,  effectiveness =    50% at best > continue breztri pending pfts  ?- 06/27/2021   Walked on RA  x  3  lap(s) =  approx 450  ft  @ fast pace, stopped due to end of study, onset  sob on 2nd lap  with lowest 02 sats 95% ? ?Not clear she really has copd but can't rule out and clinically  Group D (now reclassified as E) in terms of symptom/risk and laba/lama/ICS  therefore appropriate rx at this point >>>  breztri and approp saba  ? ?Re SABA :  I spent extra time with pt today reviewing appropriate use of albuterol for prn use on exertion with the following points: ?1) saba is for relief of sob that does not improve by walking a slower pace or resting but rather if the pt does not improve after trying this first. ?2) If the pt is convinced, as many are, that saba helps recover from activity faster then it's easy to tell if this is the case by re-challenging : ie stop, take the inhaler, then p 5 minutes try the exact same activity (intensity of workload) that just caused the symptoms and see if they are substantially diminished or not after saba ?3) if there is an activity that reproducibly causes the symptoms, try the saba 15 min before the activity on alternate days  ? ?If in fact the saba really does help, then fine to continue to use it prn but advised may need to look closer at the maintenance regimen being used to achieve better control of airways disease with exertion.  ? ?>> f/u in 3 m with pfts, call sooner if needed  ? ? ?Each maintenance medication was reviewed in detail including emphasizing most importantly the difference between maintenance and prns and under what circumstances the prns are to be triggered using an action plan format where appropriate. ? ?Total time for H and P, chart review, counseling,  directly observing portions of ambulatory 02 saturation  study/ and generating customized AVS unique to this office visit / same day charting > 30  min new pt eval ?     ?  ?      ?  ?

## 2021-06-27 NOTE — Progress Notes (Signed)
? ?Robin Bridges, female    DOB: December 13, 1945  MRN: [id number] ? ? ?Brief patient profile:  ?60   yobf  quit smoking Aug 2022 with onset of cough > sob at death of husband 08/02/2018 referred to pulmonary clinic in [place]  06/27/2021 by Dr Robin Bridges for   ?? Copd with 20 lb wt gain since stopped  ? ? ? ?History of Present Illness  ?06/27/2021  Pulmonary/ 1st office eval/ Robin Bridges / [facility]  ?Chief Complaint  ?Patient presents with  ? Consult  ?  Ref by Dr. Allena Bridges for ongoing bronchitis/cough/sob  ?Patient believes she could have asthma. She states the pollen and strong perfume smells set off her coughing and SOB   ?Dyspnea:  walk  better p rx  breztri / still  doe vacuuming / shopping is  ok  ?Cough: typically p supper  but not really taking breztri 2 q 12 and seems better p pm breztri when remembers to take it / sputum mucoid  ?Sleep: ok hs  ?SABA use: twice weekly ? ?No obvious day to day or daytime variability or assoc excess/ purulent sputum or mucus plugs or hemoptysis or cp or chest tightness, subjective wheeze or overt sinus or hb symptoms.  ? ?sleeping without nocturnal  or early am exacerbation  of respiratory  c/o's or need for noct saba. Also denies any obvious fluctuation of symptoms with weather or environmental changes or other aggravating or alleviating factors except as outlined above  ? ?No unusual exposure hx or h/o childhood pna/ asthma or knowledge of premature birth. ? ?Current Allergies, Complete Past Medical History, Past Surgical History, Family History, and Social History were reviewed in Robin Bridges record. ? ?ROS  The following are not active complaints unless bolded ?Hoarseness, sore throat, dysphagia, dental problems, itching, sneezing,  nasal congestion or discharge of excess mucus or purulent secretions, ear ache,   fever, chills, sweats, unintended wt loss or wt gain, classically pleuritic or exertional cp,  orthopnea pnd or arm/hand swelling  or leg swelling,  presyncope, palpitations, abdominal pain, anorexia, nausea, vomiting, diarrhea  or change in bowel habits or change in bladder habits, change in stools or change in urine, dysuria, hematuria,  rash, arthralgias, visual complaints, headache, numbness, weakness or ataxia or problems with walking or coordination,  change in mood or  memory. ?      ?   ? ?Past Medical History:  ?Diagnosis Date  ? Hypertension   ? ? ?Outpatient Medications Prior to Visit  ?Medication Sig Dispense Refill  ? albuterol (VENTOLIN HFA) 108 (90 Base) MCG/ACT inhaler Inhale 2 puffs into the lungs every 6 (six) hours as needed for wheezing or shortness of breath. 8 g 4  ? ALPRAZolam (XANAX) 0.5 MG tablet Take 0.5 mg by mouth 2 (two) times daily as needed for anxiety. Take 1 tablet by mouth twice daily as needed for anxiety    ? amLODipine (NORVASC) 10 MG tablet Take 1 tablet (10 mg total) by mouth daily. 90 tablet 3  ? aspirin EC 81 MG tablet Take 81 mg by mouth daily.    ? atorvastatin (LIPITOR) 10 MG tablet Take 1 tablet (10 mg total) by mouth daily. 90 tablet 1  ? Budeson-Glycopyrrol-Formoterol (BREZTRI AEROSPHERE) 160-9-4.8 MCG/ACT AERO Inhale 2 puffs into the lungs 2 (two) times daily. 10.7 g 11  ? hydrochlorothiazide (MICROZIDE) 12.5 MG capsule TAKE ONE CAPSULE BY MOUTH EVERY DAY 90 capsule 1  ? MELOXICAM PO Take by mouth.    ?  montelukast (SINGULAIR) 10 MG tablet Take 1 tablet (10 mg total) by mouth at bedtime. 90 tablet 3  ? pantoprazole (PROTONIX) 40 MG tablet Take 1 tablet (40 mg total) by mouth daily. 90 tablet 3  ? tacrolimus (PROTOPIC) 0.1 % ointment Apply topically 2 (two) times daily. 100 g 0  ? venlafaxine XR (EFFEXOR-XR) 37.5 MG 24 hr capsule Take 1 capsule (37.5 mg total) by mouth daily with breakfast. Take one capsule by mouth every day 90 capsule 3  ? ?No facility-administered medications prior to visit.  ? ? ? ?Objective:  ?  ? ?BP 140/82 (BP Location: Left Arm, Patient Position: Sitting)   Pulse 70   Temp 97.6 ?F (36.4  ?C) (Temporal)   Ht 4\' 11"  (1.499 m)   Wt 159 lb 9.6 oz (72.4 kg)   SpO2 98% Comment: ra  BMI 32.24 kg/m?  ? ?SpO2: 98 % (ra) ? ? ?Vital signs reviewed  06/27/2021  - Note at rest 02 sats  98% on RA  ? ?General appearance:    amb mod obese bf  nad  ? ? HEENT : nl exam  ? ? ?NECK :  without JVD/Nodes/TM/ nl carotid upstrokes bilaterally ? ? ?LUNGS: no acc muscle use,  Nl contour chest which is clear to A and P bilaterally without cough on insp or exp maneuvers ? ? ?CV:  RRR  no s3 or murmur or increase in P2, and no edema  ? ?ABD: obese  soft and nontender with nl inspiratory excursion in the supine position. No bruits or organomegaly appreciated, bowel sounds nl ? ?MS:  Nl gait/ ext warm without deformities, calf tenderness, cyanosis or clubbing ?No obvious joint restrictions  ? ?SKIN: warm and dry without lesions   ? ?NEURO:  alert, approp, nl sensorium with  no motor or cerebellar deficits apparent.   ? ? ?   ?I personally reviewed images and agree with radiology impression as follows:  ? Chest CT 05/14/21  ?There is slight increase in interstitial ?markings in the periphery of right lower lung fields suggesting minimal scarring. ? Coronary artery calcifications are seen. Small hiatal hernia is ?seen. Left renal cyst. ? ?Assessment  ? ?Asthmatic bronchitis , chronic (HCC) ?Onset around 2020 p husband's death with 20 lb wt gain  ?- stopped smoking 10/2020 at wt 151  ?- 06/27/2021  After extensive coaching inhaler device,  effectiveness =    50% at best > continue breztri pending pfts  ?- 06/27/2021   Walked on RA  x  3  lap(s) =  approx 450  ft  @ fast pace, stopped due to end of study, onset  sob on 2nd lap  with lowest 02 sats 95% ? ?Not clear she really has copd but can't rule out and clinically  Group D (now reclassified as E) in terms of symptom/risk and laba/lama/ICS  therefore appropriate rx at this point >>>  breztri and approp saba  ? ?Re SABA :  I spent extra time with pt today reviewing appropriate use of  albuterol for prn use on exertion with the following points: ?1) saba is for relief of sob that does not improve by walking a slower pace or resting but rather if the pt does not improve after trying this first. ?2) If the pt is convinced, as many are, that saba helps recover from activity faster then it's easy to tell if this is the case by re-challenging : ie stop, take the inhaler, then p 5 minutes try  the exact same activity (intensity of workload) that just caused the symptoms and see if they are substantially diminished or not after saba ?3) if there is an activity that reproducibly causes the symptoms, try the saba 15 min before the activity on alternate days  ? ?If in fact the saba really does help, then fine to continue to use it prn but advised may need to look closer at the maintenance regimen being used to achieve better control of airways disease with exertion.  ? ?>> f/u in 3 m with pfts, call sooner if needed  ? ? ?Each maintenance medication was reviewed in detail including emphasizing most importantly the difference between maintenance and prns and under what circumstances the prns are to be triggered using an action plan format where appropriate. ? ?Total time for H and P, chart review, counseling,  directly observing portions of ambulatory 02 saturation study/ and generating customized AVS unique to this office visit / same day charting > 30  min new pt eval ?     ?  ?      ?  ? ? ? ? ?Sandrea Hughs, MD ?06/27/2021 ?   ?

## 2021-06-27 NOTE — Patient Instructions (Addendum)
Plan A = Automatic = Always=  continue  Breztri Take 2 puffs first thing in am and then another 2 puffs about 12 hours later.  ?  ?Work on inhaler technique:  relax and gently blow all the way out then take a nice smooth full deep breath back in, triggering the inhaler at same time you start breathing in.  Hold for up to 5 seconds if you can. Blow out thru nose. Rinse and gargle with water when done.  If mouth or throat bother you at all,  try brushing teeth/gums/tongue with arm and hammer toothpaste/ make a slurry and gargle and spit out.  ? ?-  remember how a golfer takes practice swings  ? ?   ?Plan B = Backup (to supplement plan A, not to replace it) ?Only use your albuterol inhaler as a rescue medication to be used if you can't catch your breath by resting or doing a relaxed purse lip breathing pattern.  ?- The less you use it, the better it will work when you need it. ?- Ok to use the inhaler up to 2 puffs  every 4 hours if you must but call for appointment if use goes up over your usual need ?- Don't leave home without it !!  (think of it like the spare tire for your car)  ?  ? ?Please schedule a follow up visit in 3 months but call sooner if needed with pfts in meantime  ?

## 2021-06-28 ENCOUNTER — Encounter: Payer: Self-pay | Admitting: Internal Medicine

## 2021-07-01 ENCOUNTER — Ambulatory Visit (INDEPENDENT_AMBULATORY_CARE_PROVIDER_SITE_OTHER): Payer: Medicare PPO | Admitting: Internal Medicine

## 2021-07-01 DIAGNOSIS — J449 Chronic obstructive pulmonary disease, unspecified: Secondary | ICD-10-CM

## 2021-07-01 LAB — PULMONARY FUNCTION TEST
DL/VA % pred: 115 %
DL/VA: 4.91 ml/min/mmHg/L
DLCO cor % pred: 98 %
DLCO cor: 16.21 ml/min/mmHg
DLCO unc % pred: 98 %
DLCO unc: 16.21 ml/min/mmHg
FEF 25-75 Post: 2.29 L/sec
FEF 25-75 Pre: 1.08 L/sec
FEF2575-%Change-Post: 111 %
FEF2575-%Pred-Post: 157 %
FEF2575-%Pred-Pre: 74 %
FEV1-%Change-Post: 21 %
FEV1-%Pred-Post: 83 %
FEV1-%Pred-Pre: 68 %
FEV1-Post: 1.44 L
FEV1-Pre: 1.19 L
FEV1FVC-%Change-Post: 3 %
FEV1FVC-%Pred-Pre: 105 %
FEV6-%Change-Post: 17 %
FEV6-%Pred-Post: 80 %
FEV6-%Pred-Pre: 68 %
FEV6-Post: 1.76 L
FEV6-Pre: 1.5 L
FEV6FVC-%Pred-Post: 105 %
FEV6FVC-%Pred-Pre: 105 %
FVC-%Change-Post: 17 %
FVC-%Pred-Post: 76 %
FVC-%Pred-Pre: 64 %
FVC-Post: 1.76 L
FVC-Pre: 1.5 L
Post FEV1/FVC ratio: 81 %
Post FEV6/FVC ratio: 100 %
Pre FEV1/FVC ratio: 79 %
Pre FEV6/FVC Ratio: 100 %
RV % pred: 105 %
RV: 2.15 L
TLC % pred: 85 %
TLC: 3.73 L

## 2021-07-01 NOTE — Progress Notes (Signed)
Full PFT completed today ? ?

## 2021-07-09 NOTE — Progress Notes (Signed)
Spoke with pt and notified of results per Dr. Wert. Pt verbalized understanding and denied any questions. 

## 2021-07-15 ENCOUNTER — Ambulatory Visit (HOSPITAL_COMMUNITY)
Admission: RE | Admit: 2021-07-15 | Discharge: 2021-07-15 | Disposition: A | Discharge status: Home / Self Care | Payer: Medicare PPO | Source: Ambulatory Visit | Attending: Internal Medicine | Admitting: Internal Medicine

## 2021-07-15 ENCOUNTER — Encounter (HOSPITAL_COMMUNITY): Payer: Self-pay | Admitting: Radiology

## 2021-07-15 DIAGNOSIS — Z1231 Encounter for screening mammogram for malignant neoplasm of breast: Secondary | ICD-10-CM | POA: Insufficient documentation

## 2021-07-20 DIAGNOSIS — E785 Hyperlipidemia, unspecified: Secondary | ICD-10-CM | POA: Diagnosis not present

## 2021-07-20 DIAGNOSIS — R42 Dizziness and giddiness: Secondary | ICD-10-CM | POA: Diagnosis not present

## 2021-07-20 DIAGNOSIS — R110 Nausea: Secondary | ICD-10-CM | POA: Diagnosis not present

## 2021-07-20 DIAGNOSIS — R519 Headache, unspecified: Secondary | ICD-10-CM | POA: Diagnosis not present

## 2021-07-20 DIAGNOSIS — R079 Chest pain, unspecified: Secondary | ICD-10-CM | POA: Diagnosis not present

## 2021-07-20 DIAGNOSIS — I10 Essential (primary) hypertension: Secondary | ICD-10-CM | POA: Diagnosis not present

## 2021-07-22 ENCOUNTER — Other Ambulatory Visit: Payer: Self-pay | Admitting: Internal Medicine

## 2021-07-26 ENCOUNTER — Ambulatory Visit: Payer: Medicare PPO | Admitting: Internal Medicine

## 2021-07-26 ENCOUNTER — Encounter: Payer: Self-pay | Admitting: Internal Medicine

## 2021-07-26 VITALS — BP 132/82 | HR 77 | Resp 18 | Ht 59.0 in | Wt 153.2 lb

## 2021-07-26 DIAGNOSIS — R42 Dizziness and giddiness: Secondary | ICD-10-CM

## 2021-07-26 DIAGNOSIS — I10 Essential (primary) hypertension: Secondary | ICD-10-CM

## 2021-07-26 DIAGNOSIS — M25511 Pain in right shoulder: Secondary | ICD-10-CM

## 2021-07-26 DIAGNOSIS — Z09 Encounter for follow-up examination after completed treatment for conditions other than malignant neoplasm: Secondary | ICD-10-CM

## 2021-07-26 MED ORDER — NAPROXEN 500 MG PO TABS: 500.0000 mg | ORAL_TABLET | Freq: Two times a day (BID) | ORAL | 1 refills | Status: DC

## 2021-07-26 NOTE — Assessment & Plan Note (Signed)
Improved with meclizine as needed

## 2021-07-26 NOTE — Progress Notes (Signed)
Acute Office Visit  Subjective:    Patient ID: Robin Bridges, female    DOB: December 01, 1945, 76 y.o.   MRN: [id number]  Chief Complaint  Patient presents with   Acute Visit    Right rotator cuff has been hurting since 05-26-21 she has been taking tylenol but feels like this isnt working also went to urgent care and ER on 07-20-21 did CT EKG and chest xray diagnosed with vertigo was given meclizine and promethazine this is a little better but still worse at night     HPI Patient is in today for complaint of right shoulder pain for the last 2 months.  Her pain is constant, dull, worse with lateral movement above head, but denies any numbness or tingling of the UE.  She denies any recent injury or fall.  She also reports pain around neck area, for which she has tried [employer] with some relief.  She went to urgent care recently with dizziness.  Her BP was significantly elevated and was referred to ER for CVA eval.  Her CT head was negative for any acute pathology.  She was given hydralazine IV for elevated BP.  She was diagnosed with vertigo and was given meclizine as needed, which has helped with dizziness.  BP is well controlled today.  She denies any headache, chest pain, dyspnea or palpitations.  Past Medical History:  Diagnosis Date   Hypertension     Past Surgical History:  Procedure Laterality Date   ABDOMINAL HYSTERECTOMY     COLONOSCOPY N/A 02/19/2017   Procedure: COLONOSCOPY;  Surgeon: Malissa Hippoehman, Najeeb U, MD;  Location: [facility];  Service: Endoscopy;  Laterality: N/A;  830    Family History  Problem Relation Age of Onset   Breast cancer Maternal Aunt     Social History   Socioeconomic History   Marital status: Single    Spouse name: Not on file   Number of children: Not on file   Years of education: Not on file   Highest education level: Not on file  Occupational History   Not on file  Tobacco Use   Smoking status: Former    Types: Cigars   Smokeless tobacco: Never   Substance and Sexual Activity   Alcohol use: Yes    Alcohol/week: 1.0 standard drink    Types: 1 Cans of beer per week    Comment: some days   Drug use: Yes    Types: Marijuana    Comment: here and there for pain   Sexual activity: Not on file  Other Topics Concern   Not on file  Social History Narrative   Not on file   Social Determinants of Health   Financial Resource Strain: Low Risk    Difficulty of Paying Living Expenses: Not hard at all  Food Insecurity: No Food Insecurity   Worried About Programme researcher, broadcasting/film/videounning Out of Food in the Last Year: Never true   Ran Out of Food in the Last Year: Never true  Transportation Needs: No Transportation Needs   Lack of Transportation (Medical): No   Lack of Transportation (Non-Medical): No  Physical Activity: Insufficiently Active   Days of Exercise per Week: 2 days   Minutes of Exercise per Session: 60 min  Stress: No Stress Concern Present   Feeling of Stress : Not at all  Social Connections: Moderately Isolated   Frequency of Communication with Friends and Family: More than three times a week   Frequency of Social Gatherings with Friends and  Family: More than three times a week   Attends Religious Services: More than 4 times per year   Active Member of Clubs or Organizations: No   Attends Banker Meetings: Never   Marital Status: Widowed  Catering manager Violence: Not At Risk   Fear of Current or Ex-Partner: No   Emotionally Abused: No   Physically Abused: No   Sexually Abused: No    Outpatient Medications Prior to Visit  Medication Sig Dispense Refill   albuterol (VENTOLIN HFA) 108 (90 Base) MCG/ACT inhaler Inhale 2 puffs into the lungs every 6 (six) hours as needed for wheezing or shortness of breath. 8 g 4   ALPRAZolam (XANAX) 0.5 MG tablet TAKE 1 TABLET BY MOUTH TWICE DAILY AS NEEDED FOR ANXIETY 60 tablet 4   amLODipine (NORVASC) 10 MG tablet Take 1 tablet (10 mg total) by mouth daily. 90 tablet 3   aspirin EC 81 MG  tablet Take 81 mg by mouth daily.     atorvastatin (LIPITOR) 10 MG tablet Take 1 tablet (10 mg total) by mouth daily. 90 tablet 1   Budeson-Glycopyrrol-Formoterol (BREZTRI AEROSPHERE) 160-9-4.8 MCG/ACT AERO Inhale 2 puffs into the lungs 2 (two) times daily. 10.7 g 11   Budeson-Glycopyrrol-Formoterol (BREZTRI AEROSPHERE) 160-9-4.8 MCG/ACT AERO Inhale 2 puffs into the lungs in the morning and at bedtime. 10.7 g 0   hydrochlorothiazide (MICROZIDE) 12.5 MG capsule TAKE ONE CAPSULE BY MOUTH EVERY DAY 90 capsule 1   meclizine (ANTIVERT) 12.5 MG tablet Take by mouth.     montelukast (SINGULAIR) 10 MG tablet Take 1 tablet (10 mg total) by mouth at bedtime. 90 tablet 3   pantoprazole (PROTONIX) 40 MG tablet Take 1 tablet (40 mg total) by mouth daily. 90 tablet 3   promethazine (PHENERGAN) 25 MG tablet Take by mouth.     tacrolimus (PROTOPIC) 0.1 % ointment Apply topically 2 (two) times daily. 100 g 0   venlafaxine XR (EFFEXOR-XR) 37.5 MG 24 hr capsule Take 1 capsule (37.5 mg total) by mouth daily with breakfast. Take one capsule by mouth every day 90 capsule 3   MELOXICAM PO Take by mouth.     No facility-administered medications prior to visit.    No Known Allergies  Review of Systems  Constitutional:  Negative for chills and fever.  HENT:  Negative for congestion, postnasal drip, sinus pain and sore throat.   Eyes:  Negative for pain and discharge.  Respiratory:  Negative for cough, shortness of breath and wheezing.   Cardiovascular:  Negative for chest pain and palpitations.  Gastrointestinal:  Negative for abdominal pain, diarrhea, nausea and vomiting.  Endocrine: Negative for polydipsia and polyuria.  Genitourinary:  Negative for dysuria and hematuria.  Musculoskeletal:  Positive for arthralgias. Negative for neck pain and neck stiffness.  Skin:  Negative for rash.  Neurological:  Negative for dizziness and weakness.  Psychiatric/Behavioral:  Negative for agitation and behavioral problems.        Objective:    Physical Exam Vitals reviewed.  Constitutional:      General: She is not in acute distress.    Appearance: She is not diaphoretic.  HENT:     Head: Normocephalic and atraumatic.     Nose: No congestion.     Mouth/Throat:     Mouth: Mucous membranes are moist.  Eyes:     General: No scleral icterus.    Extraocular Movements: Extraocular movements intact.  Cardiovascular:     Rate and Rhythm: Normal rate and regular rhythm.  Pulses: Normal pulses.     Heart sounds: Normal heart sounds. No murmur heard. Pulmonary:     Breath sounds: Normal breath sounds. No wheezing or rales.  Abdominal:     Palpations: Abdomen is soft.     Tenderness: There is no abdominal tenderness.  Musculoskeletal:     Right shoulder: No tenderness. Decreased range of motion.     Cervical back: Neck supple. No tenderness.     Right lower leg: No edema.     Left lower leg: No edema.  Skin:    General: Skin is warm.     Findings: No rash.     Comments: Skin tag over neck region  Neurological:     General: No focal deficit present.     Mental Status: She is alert and oriented to person, place, and time.     Sensory: No sensory deficit.     Motor: No weakness.  Psychiatric:        Mood and Affect: Mood normal.        Behavior: Behavior normal.    BP 132/82 (BP Location: Left Arm, Patient Position: Sitting, Cuff Size: Normal)   Pulse 77   Resp 18   Ht 4\' 11"  (1.499 m)   Wt 153 lb 3.2 oz (69.5 kg)   SpO2 95%   BMI 30.94 kg/m  Wt Readings from Last 3 Encounters:  07/26/21 153 lb 3.2 oz (69.5 kg)  06/27/21 159 lb 9.6 oz (72.4 kg)  06/11/21 159 lb 3.2 oz (72.2 kg)        Assessment & Plan:   Problem List Items Addressed This Visit       Cardiovascular and Mediastinum   Benign essential hypertension    BP Readings from Last 1 Encounters:  07/26/21 132/82  Well-controlled with amlodipine and HCTZ Counseled for compliance with the medications Advised DASH diet and  moderate exercise/walking, at least 150 mins/week        Other   Vertigo    Improved with meclizine as needed       Acute pain of right shoulder - Primary    Could be OA of shoulder and/or rotator cuff injury Naproxen PRN for pain Referred to orthopedic surgery       Relevant Medications   naproxen (NAPROSYN) 500 MG tablet   Other Relevant Orders   Ambulatory referral to Orthopedic Surgery   Encounter for examination following treatment at hospital    ER chart reviewed, including imaging Meclizine as needed for dizziness CT head negative for any acute intracranial pathology         Meds ordered this encounter  Medications   naproxen (NAPROSYN) 500 MG tablet    Sig: Take 1 tablet (500 mg total) by mouth 2 (two) times daily with a meal.    Dispense:  30 tablet    Refill:  1     Kollyns Mickelson 07/28/21, MD

## 2021-07-26 NOTE — Patient Instructions (Signed)
Please take Naproxen as needed for shoulder pain.  Please take Meclizine as needed for dizziness and nausea.

## 2021-07-26 NOTE — Assessment & Plan Note (Signed)
ER chart reviewed, including imaging Meclizine as needed for dizziness CT head negative for any acute intracranial pathology

## 2021-07-26 NOTE — Assessment & Plan Note (Signed)
BP Readings from Last 1 Encounters:  07/26/21 132/82   Well-controlled with amlodipine and HCTZ Counseled for compliance with the medications Advised DASH diet and moderate exercise/walking, at least 150 mins/week

## 2021-07-26 NOTE — Assessment & Plan Note (Signed)
Could be OA of shoulder and/or rotator cuff injury Naproxen PRN for pain Referred to orthopedic surgery

## 2021-07-29 ENCOUNTER — Ambulatory Visit: Payer: Medicare PPO | Admitting: Internal Medicine

## 2021-08-06 ENCOUNTER — Ambulatory Visit: Payer: Medicare PPO | Admitting: Orthopedic Surgery

## 2021-08-06 ENCOUNTER — Encounter: Payer: Self-pay | Admitting: Orthopedic Surgery

## 2021-08-06 ENCOUNTER — Ambulatory Visit (INDEPENDENT_AMBULATORY_CARE_PROVIDER_SITE_OTHER): Payer: Medicare PPO

## 2021-08-06 VITALS — Ht 59.0 in | Wt 154.0 lb

## 2021-08-06 DIAGNOSIS — G8929 Other chronic pain: Secondary | ICD-10-CM

## 2021-08-06 DIAGNOSIS — M7581 Other shoulder lesions, right shoulder: Secondary | ICD-10-CM

## 2021-08-06 DIAGNOSIS — M25511 Pain in right shoulder: Secondary | ICD-10-CM

## 2021-08-06 NOTE — Progress Notes (Signed)
New Patient Visit  Assessment: Robin Bridges is a 76 y.o. female with the following: 1. Tendinitis of right rotator cuff  Plan: [place] has pain in the anterior right shoulder.  She has difficulty with overhead motion.  Atraumatic onset.  This is been ongoing for the past 5-6.  Over-the-counter pain medications have been insufficient.  She is interested in an injection.  This was completed in clinic today.  Follow-up in 3 months for repeat evaluation.    Procedure note injection - Right shoulder    Verbal consent was obtained to inject the right shoulder, subacromial space Timeout was completed to confirm the site of injection.   The skin was prepped with alcohol and ethyl chloride was sprayed at the injection site.  A 21-gauge needle was used to inject 40 mg of Depo-Medrol and 1% lidocaine (3 cc) into the subacromial space of the right shoulder using a posterolateral approach.  There were no complications.  A sterile bandage was applied.     Follow-up: Return in about 3 months (around 11/06/2021).  Subjective:  Chief Complaint  Patient presents with   Shoulder Pain    Rt shoulder pain for 5-6 mos.     History of Present Illness: Robin Bridges is a 76 y.o. female who has been referred by  Trena Platt, MD for evaluation of right shoulder pain.  She localizes the pain to the anterior right shoulder.  It started in January, without a specific injury.  She has difficulty with overhead motion.  Pain radiates distally towards her elbow.  Pain gets worse at night.  She has tried some Tylenol with limited improvement.  She has recently started some naproxen, and this takes the edge off of the pain she states.  No prior injections.  No prior injury to her right shoulder.  She has not worked with physical therapy.   Review of Systems: No fevers or chills No numbness or tingling No chest pain No shortness of breath No bowel or bladder dysfunction No GI distress No  headaches   Medical History:  Past Medical History:  Diagnosis Date   Hypertension     Past Surgical History:  Procedure Laterality Date   ABDOMINAL HYSTERECTOMY     COLONOSCOPY N/A 02/19/2017   Procedure: COLONOSCOPY;  Surgeon: Malissa Hippo, MD;  Location: [facility];  Service: Endoscopy;  Laterality: N/A;  830    Family History  Problem Relation Age of Onset   Breast cancer Maternal Aunt    Social History   Tobacco Use   Smoking status: Former    Types: Cigars   Smokeless tobacco: Never  Substance Use Topics   Alcohol use: Yes    Alcohol/week: 1.0 standard drink    Types: 1 Cans of beer per week    Comment: some days   Drug use: Yes    Types: Marijuana    Comment: here and there for pain    No Known Allergies  No outpatient medications have been marked as taking for the 08/06/21 encounter (Office Visit) with Oliver Barre, MD.    Objective: Ht 4\' 11"  (1.499 m)   Wt 154 lb (69.9 kg)   BMI 31.10 kg/m   Physical Exam:  General: Alert and oriented. and No acute distress. Gait: Normal gait.  Evaluation of the right shoulder demonstrates no deformity.  She is tenderness to palpation over the anterior shoulder.  Tenderness to palpation over the bicipital groove.  Forward flexion to 110 degrees,  with pain beyond this with passive motion.  Positive drop arm test.  Positive Jobe's.  Fingers are warm and well-perfused.  IMAGING: I personally ordered and reviewed the following images  X-ray of the right shoulder was obtained in clinic today.  No acute injuries are noted.  Glenohumeral joint space is well-maintained.  No evidence of proximal humeral migration.  Minimal AC joint degenerative changes.  There are some small osteophytes noted in the inferior aspect of the humeral head.  Impression: Right shoulder x-ray with minimal degenerative changes   New Medications:  No orders of the defined types were placed in this encounter.     Oliver Barre,  MD  08/06/2021 11:20 AM

## 2021-08-06 NOTE — Patient Instructions (Signed)

## 2021-08-30 ENCOUNTER — Ambulatory Visit: Payer: Medicare PPO | Admitting: Nurse Practitioner

## 2021-08-30 ENCOUNTER — Encounter: Payer: Self-pay | Admitting: Nurse Practitioner

## 2021-08-30 DIAGNOSIS — J069 Acute upper respiratory infection, unspecified: Secondary | ICD-10-CM

## 2021-08-30 DIAGNOSIS — J309 Allergic rhinitis, unspecified: Secondary | ICD-10-CM | POA: Diagnosis not present

## 2021-08-30 LAB — POCT INFLUENZA A/B
Influenza A, POC: NEGATIVE
Influenza B, POC: NEGATIVE

## 2021-08-30 MED ORDER — FLUTICASONE PROPIONATE 50 MCG/ACT NA SUSP: 2.0000 | Freq: Every day | NASAL | 6 refills | Status: AC

## 2021-08-30 MED ORDER — BENZONATATE 100 MG PO CAPS: 100.0000 mg | ORAL_CAPSULE | Freq: Two times a day (BID) | ORAL | 0 refills | Status: DC | PRN

## 2021-08-30 NOTE — Assessment & Plan Note (Signed)
Continue Singulair 10 mg daily Start Flonase nasal spray 2 spray in each nostril daily

## 2021-08-30 NOTE — Assessment & Plan Note (Addendum)
Cough started 2 days ago Patient told to come by the office for COVID and flu test Take Tylenol 650 mg every 6 hours as needed for headache Tessalon 100 mg twice daily as needed cough Albuterol inhaler as needed for shortness of breath and wheezing

## 2021-09-01 LAB — NOVEL CORONAVIRUS, NAA: SARS-CoV-2, NAA: NOT DETECTED

## 2021-09-05 DIAGNOSIS — H35463 Secondary vitreoretinal degeneration, bilateral: Secondary | ICD-10-CM | POA: Diagnosis not present

## 2021-09-05 LAB — HM DIABETES EYE EXAM

## 2021-09-12 ENCOUNTER — Ambulatory Visit: Payer: Medicare PPO | Admitting: Internal Medicine

## 2021-09-12 ENCOUNTER — Encounter: Payer: Self-pay | Admitting: Internal Medicine

## 2021-09-12 VITALS — BP 132/78 | HR 79 | Resp 18 | Ht 59.0 in | Wt 157.6 lb

## 2021-09-12 DIAGNOSIS — F411 Generalized anxiety disorder: Secondary | ICD-10-CM

## 2021-09-12 DIAGNOSIS — I10 Essential (primary) hypertension: Secondary | ICD-10-CM | POA: Diagnosis not present

## 2021-09-12 DIAGNOSIS — J449 Chronic obstructive pulmonary disease, unspecified: Secondary | ICD-10-CM | POA: Diagnosis not present

## 2021-09-12 DIAGNOSIS — Z23 Encounter for immunization: Secondary | ICD-10-CM

## 2021-09-12 NOTE — Assessment & Plan Note (Signed)
BP Readings from Last 1 Encounters:  09/12/21 132/78   Well-controlled with amlodipine and HCTZ Counseled for compliance with the medications Advised DASH diet and moderate exercise/walking, at least 150 mins/week

## 2021-09-12 NOTE — Patient Instructions (Signed)
Please continue taking medications as prescribed.  You were given PCV20 vaccine in the office today.  Please consider getting Shingrix and Tdap vaccines at your local pharmacy.

## 2021-09-12 NOTE — Assessment & Plan Note (Signed)
Well controlled with Breztri and as needed albuterol now

## 2021-09-12 NOTE — Progress Notes (Signed)
Established Patient Office Visit  Subjective:  Patient ID: Robin Bridges, female    DOB: 02-17-1946  Age: 76 y.o. MRN: [id number]  CC:  Chief Complaint  Patient presents with   Follow-up    3 month follow up     HPI Robin Bridges is a 76 y.o. female with past medical history of HTN, HLD, COPD, prediabetes, GERD, anxiety and tobacco abuse who presents for f/u of her chronic medical conditions.  GAD: She has been taking Effexor and as needed Xanax.  Denies any anhedonia, SI or HI currently.  BP is well-controlled. Takes medications regularly. Patient denies headache, dizziness, chest pain, dyspnea or palpitations.  COPD: She currently denies any fever, chills, dyspnea or wheezing.  She has been doing better with [facility] and as needed albuterol now.  She received PCV20 in the office today.   Past Medical History:  Diagnosis Date   Hypertension     Past Surgical History:  Procedure Laterality Date   ABDOMINAL HYSTERECTOMY     COLONOSCOPY N/A 02/19/2017   Procedure: COLONOSCOPY;  Surgeon: Rogene Houston, MD;  Location: [facility];  Service: Endoscopy;  Laterality: N/A;  7    Family History  Problem Relation Age of Onset   Breast cancer Maternal Aunt     Social History   Socioeconomic History   Marital status: Single    Spouse name: Not on file   Number of children: Not on file   Years of education: Not on file   Highest education level: Not on file  Occupational History   Not on file  Tobacco Use   Smoking status: Former    Types: Cigars   Smokeless tobacco: Never  Substance and Sexual Activity   Alcohol use: Yes    Alcohol/week: 1.0 standard drink of alcohol    Types: 1 Cans of beer per week    Comment: some days   Drug use: Yes    Types: Marijuana    Comment: here and there for pain   Sexual activity: Not on file  Other Topics Concern   Not on file  Social History Narrative   Not on file   Social Determinants of Health   Financial Resource  Strain: Low Risk  (10/02/2020)   Overall Financial Resource Strain (CARDIA)    Difficulty of Paying Living Expenses: Not hard at all  Food Insecurity: No Food Insecurity (10/02/2020)   Hunger Vital Sign    Worried About Running Out of Food in the Last Year: Never true    Ran Out of Food in the Last Year: Never true  Transportation Needs: No Transportation Needs (10/02/2020)   PRAPARE - Hydrologist (Medical): No    Lack of Transportation (Non-Medical): No  Physical Activity: Insufficiently Active (10/02/2020)   Exercise Vital Sign    Days of Exercise per Week: 2 days    Minutes of Exercise per Session: 60 min  Stress: No Stress Concern Present (10/02/2020)   [place]    Feeling of Stress : Not at all  Social Connections: Moderately Isolated (10/02/2020)   Social Connection and Isolation Panel [NHANES]    Frequency of Communication with Friends and Family: More than three times a week    Frequency of Social Gatherings with Friends and Family: More than three times a week    Attends Religious Services: More than 4 times per year    Active Member of [employer]  or Organizations: No    Attends Archivist Meetings: Never    Marital Status: Widowed  Intimate Partner Violence: Not At Risk (10/02/2020)   Humiliation, Afraid, Rape, and Kick questionnaire    Fear of Current or Ex-Partner: No    Emotionally Abused: No    Physically Abused: No    Sexually Abused: No    Outpatient Medications Prior to Visit  Medication Sig Dispense Refill   albuterol (VENTOLIN HFA) 108 (90 Base) MCG/ACT inhaler Inhale 2 puffs into the lungs every 6 (six) hours as needed for wheezing or shortness of breath. 8 g 4   ALPRAZolam (XANAX) 0.5 MG tablet TAKE 1 TABLET BY MOUTH TWICE DAILY AS NEEDED FOR ANXIETY 60 tablet 4   amLODipine (NORVASC) 10 MG tablet Take 1 tablet (10 mg total) by mouth daily. 90 tablet 3   aspirin  EC 81 MG tablet Take 81 mg by mouth daily.     atorvastatin (LIPITOR) 10 MG tablet Take 1 tablet (10 mg total) by mouth daily. 90 tablet 1   benzonatate (TESSALON) 100 MG capsule Take 1 capsule (100 mg total) by mouth 2 (two) times daily as needed for cough. 20 capsule 0   Budeson-Glycopyrrol-Formoterol (BREZTRI AEROSPHERE) 160-9-4.8 MCG/ACT AERO Inhale 2 puffs into the lungs 2 (two) times daily. 10.7 g 11   fluticasone (FLONASE) 50 MCG/ACT nasal spray Place 2 sprays into both nostrils daily. 16 g 6   hydrochlorothiazide (MICROZIDE) 12.5 MG capsule TAKE ONE CAPSULE BY MOUTH EVERY DAY 90 capsule 1   meclizine (ANTIVERT) 12.5 MG tablet Take by mouth.     montelukast (SINGULAIR) 10 MG tablet Take 1 tablet (10 mg total) by mouth at bedtime. 90 tablet 3   naproxen (NAPROSYN) 500 MG tablet Take 1 tablet (500 mg total) by mouth 2 (two) times daily with a meal. 30 tablet 1   pantoprazole (PROTONIX) 40 MG tablet Take 1 tablet (40 mg total) by mouth daily. 90 tablet 3   tacrolimus (PROTOPIC) 0.1 % ointment Apply topically 2 (two) times daily. 100 g 0   venlafaxine XR (EFFEXOR-XR) 37.5 MG 24 hr capsule Take 1 capsule (37.5 mg total) by mouth daily with breakfast. Take one capsule by mouth every day 90 capsule 3   Budeson-Glycopyrrol-Formoterol (BREZTRI AEROSPHERE) 160-9-4.8 MCG/ACT AERO Inhale 2 puffs into the lungs in the morning and at bedtime. 10.7 g 0   No facility-administered medications prior to visit.    No Known Allergies  ROS Review of Systems  Constitutional:  Negative for chills and fever.  HENT:  Negative for congestion, postnasal drip, sinus pain and sore throat.   Eyes:  Negative for pain and discharge.  Respiratory:  Negative for cough, shortness of breath and wheezing.   Cardiovascular:  Negative for chest pain and palpitations.  Gastrointestinal:  Negative for abdominal pain, diarrhea, nausea and vomiting.  Endocrine: Negative for polydipsia and polyuria.  Genitourinary:  Negative  for dysuria and hematuria.  Musculoskeletal:  Positive for arthralgias. Negative for neck pain and neck stiffness.  Skin:  Negative for rash.  Neurological:  Negative for dizziness and weakness.  Psychiatric/Behavioral:  Negative for agitation and behavioral problems.       Objective:    Physical Exam Vitals reviewed.  Constitutional:      General: She is not in acute distress.    Appearance: She is not diaphoretic.  HENT:     Head: Normocephalic and atraumatic.     Nose: No congestion.     Mouth/Throat:  Mouth: Mucous membranes are moist.  Eyes:     General: No scleral icterus.    Extraocular Movements: Extraocular movements intact.  Cardiovascular:     Rate and Rhythm: Normal rate and regular rhythm.     Pulses: Normal pulses.     Heart sounds: Normal heart sounds. No murmur heard. Pulmonary:     Breath sounds: Normal breath sounds. No wheezing or rales.  Abdominal:     Palpations: Abdomen is soft.     Tenderness: There is no abdominal tenderness.  Musculoskeletal:     Cervical back: Neck supple. No tenderness.     Right lower leg: No edema.     Left lower leg: No edema.  Skin:    General: Skin is warm.     Findings: No rash.     Comments: Skin tag over neck region  Neurological:     General: No focal deficit present.     Mental Status: She is alert and oriented to person, place, and time.     Sensory: No sensory deficit.     Motor: No weakness.  Psychiatric:        Mood and Affect: Mood normal.        Behavior: Behavior normal.     BP 132/78 (BP Location: Right Arm, Patient Position: Sitting, Cuff Size: Normal)   Pulse 79   Resp 18   Ht 4' 11"  (1.499 m)   Wt 157 lb 9.6 oz (71.5 kg)   SpO2 95%   BMI 31.83 kg/m  Wt Readings from Last 3 Encounters:  09/12/21 157 lb 9.6 oz (71.5 kg)  08/06/21 154 lb (69.9 kg)  07/26/21 153 lb 3.2 oz (69.5 kg)    No results found for: "TSH" Lab Results  Component Value Date   WBC 6.9 02/08/2021   HGB 14.0  02/08/2021   HCT 42.6 02/08/2021   MCV 86 02/08/2021   PLT 295 02/08/2021   Lab Results  Component Value Date   NA 140 02/08/2021   K 4.3 02/08/2021   CO2 22 02/08/2021   GLUCOSE 108 (H) 02/08/2021   BUN 16 02/08/2021   CREATININE 0.90 02/08/2021   BILITOT 0.2 02/08/2021   ALKPHOS 50 02/08/2021   AST 27 02/08/2021   ALT 36 (H) 02/08/2021   PROT 6.7 02/08/2021   ALBUMIN 4.5 02/08/2021   CALCIUM 9.3 02/08/2021   EGFR 68 02/08/2021   Lab Results  Component Value Date   CHOL 235 (H) 02/08/2021   Lab Results  Component Value Date   HDL 60 02/08/2021   Lab Results  Component Value Date   LDLCALC 145 (H) 02/08/2021   Lab Results  Component Value Date   TRIG 171 (H) 02/08/2021   Lab Results  Component Value Date   CHOLHDL 3.9 02/08/2021   Lab Results  Component Value Date   HGBA1C 6.3 10/08/2020   HGBA1C 6.3 10/08/2020      Assessment & Plan:   Problem List Items Addressed This Visit       Cardiovascular and Mediastinum   Benign essential hypertension    BP Readings from Last 1 Encounters:  09/12/21 132/78  Well-controlled with amlodipine and HCTZ Counseled for compliance with the medications Advised DASH diet and moderate exercise/walking, at least 150 mins/week        Respiratory   Chronic obstructive pulmonary disease (COPD) ([place])    Well controlled with Breztri and as needed albuterol now        Other   GAD (generalized  anxiety disorder) - Primary    Well-controlled with Effexor and Xanax PDMP reviewed      Other Visit Diagnoses     Need for pneumococcal 20-valent conjugate vaccination       Relevant Orders   Pneumococcal conjugate vaccine 20-valent (Prevnar 20) (Completed)       No orders of the defined types were placed in this encounter.   Follow-up: Return in about 4 months (around 01/13/2022) for HTN and GAD.    Lindell Spar, MD

## 2021-09-12 NOTE — Assessment & Plan Note (Addendum)
Well-controlled with Effexor and Xanax PDMP reviewed

## 2021-09-16 ENCOUNTER — Encounter: Payer: Self-pay | Admitting: *Deleted

## 2021-09-26 ENCOUNTER — Ambulatory Visit: Payer: Medicare PPO | Admitting: Internal Medicine

## 2021-10-03 ENCOUNTER — Ambulatory Visit: Payer: Medicare PPO

## 2021-10-04 ENCOUNTER — Ambulatory Visit (INDEPENDENT_AMBULATORY_CARE_PROVIDER_SITE_OTHER): Payer: Medicare PPO

## 2021-10-04 DIAGNOSIS — Z0000 Encounter for general adult medical examination without abnormal findings: Secondary | ICD-10-CM | POA: Diagnosis not present

## 2021-10-04 NOTE — Progress Notes (Signed)
Subjective:   Robin Bridges is a 76 y.o. female who presents for Medicare Annual (Subsequent) preventive examination. I connected with  Renard Hamper on 10/04/21 by a audio enabled telemedicine application and verified that I am speaking with the correct person using two identifiers.  Patient Location: Home  Provider Location: Office/Clinic  I discussed the limitations of evaluation and management by telemedicine. The patient expressed understanding and agreed to proceed.   Review of Systems     Ms. Carranza , Thank you for taking time to come for your Medicare Wellness Visit. I appreciate your ongoing commitment to your health goals. Please review the following plan we discussed and let me know if I can assist you in the future.   These are the goals we discussed:  Goals      Increase physical activity     Would like to exercise more         This is a list of the screening recommended for you and due dates:  Health Maintenance  Topic Date Due   Tetanus Vaccine  Never done   Zoster (Shingles) Vaccine (1 of 2) Never done   COVID-19 Vaccine (3 - Moderna risk series) 02/19/2021   Hemoglobin A1C  04/10/2021   Urine Protein Check  10/08/2021   Flu Shot  10/08/2021   Complete foot exam   02/11/2022   Eye exam for diabetics  09/06/2022   Mammogram  07/16/2023   Colon Cancer Screening  02/20/2024   Pneumonia Vaccine  Completed   DEXA scan (bone density measurement)  Completed   Hepatitis C Screening: USPSTF Recommendation to screen - Ages 67-79 yo.  Completed   HPV Vaccine  Aged Out          Objective:    There were no vitals filed for this visit. There is no height or weight on file to calculate BMI.     10/02/2020   10:19 AM 02/19/2017    7:29 AM  Advanced Directives  Does Patient Have a Medical Advance Directive? No No  Would patient like information on creating a medical advance directive? No - Patient declined No - Patient declined    Current Medications  (verified) Outpatient Encounter Medications as of 10/04/2021  Medication Sig   albuterol (VENTOLIN HFA) 108 (90 Base) MCG/ACT inhaler Inhale 2 puffs into the lungs every 6 (six) hours as needed for wheezing or shortness of breath.   ALPRAZolam (XANAX) 0.5 MG tablet TAKE 1 TABLET BY MOUTH TWICE DAILY AS NEEDED FOR ANXIETY   amLODipine (NORVASC) 10 MG tablet Take 1 tablet (10 mg total) by mouth daily.   aspirin EC 81 MG tablet Take 81 mg by mouth daily.   atorvastatin (LIPITOR) 10 MG tablet Take 1 tablet (10 mg total) by mouth daily.   benzonatate (TESSALON) 100 MG capsule Take 1 capsule (100 mg total) by mouth 2 (two) times daily as needed for cough.   Budeson-Glycopyrrol-Formoterol (BREZTRI AEROSPHERE) 160-9-4.8 MCG/ACT AERO Inhale 2 puffs into the lungs 2 (two) times daily.   fluticasone (FLONASE) 50 MCG/ACT nasal spray Place 2 sprays into both nostrils daily.   hydrochlorothiazide (MICROZIDE) 12.5 MG capsule TAKE ONE CAPSULE BY MOUTH EVERY DAY   meclizine (ANTIVERT) 12.5 MG tablet Take by mouth.   montelukast (SINGULAIR) 10 MG tablet Take 1 tablet (10 mg total) by mouth at bedtime.   naproxen (NAPROSYN) 500 MG tablet Take 1 tablet (500 mg total) by mouth 2 (two) times daily with a meal.  pantoprazole (PROTONIX) 40 MG tablet Take 1 tablet (40 mg total) by mouth daily.   tacrolimus (PROTOPIC) 0.1 % ointment Apply topically 2 (two) times daily.   venlafaxine XR (EFFEXOR-XR) 37.5 MG 24 hr capsule Take 1 capsule (37.5 mg total) by mouth daily with breakfast. Take one capsule by mouth every day   No facility-administered encounter medications on file as of 10/04/2021.    Allergies (verified) Patient has no known allergies.   History: Past Medical History:  Diagnosis Date   Hypertension    Past Surgical History:  Procedure Laterality Date   ABDOMINAL HYSTERECTOMY     COLONOSCOPY N/A 02/19/2017   Procedure: COLONOSCOPY;  Surgeon: Malissa Hippoehman, Najeeb U, MD;  Location: [facility];  Service:  Endoscopy;  Laterality: N/A;  830   Family History  Problem Relation Age of Onset   Breast cancer Maternal Aunt    Social History   Socioeconomic History   Marital status: Single    Spouse name: Not on file   Number of children: Not on file   Years of education: Not on file   Highest education level: Not on file  Occupational History   Not on file  Tobacco Use   Smoking status: Former    Types: Cigars   Smokeless tobacco: Never  Substance and Sexual Activity   Alcohol use: Yes    Alcohol/week: 1.0 standard drink of alcohol    Types: 1 Cans of beer per week    Comment: some days   Drug use: Yes    Types: Marijuana    Comment: here and there for pain   Sexual activity: Not on file  Other Topics Concern   Not on file  Social History Narrative   Not on file   Social Determinants of Health   Financial Resource Strain: Low Risk  (10/02/2020)   Overall Financial Resource Strain (CARDIA)    Difficulty of Paying Living Expenses: Not hard at all  Food Insecurity: No Food Insecurity (10/02/2020)   Hunger Vital Sign    Worried About Running Out of Food in the Last Year: Never true    Ran Out of Food in the Last Year: Never true  Transportation Needs: No Transportation Needs (10/02/2020)   PRAPARE - Administrator, Civil ServiceTransportation    Lack of Transportation (Medical): No    Lack of Transportation (Non-Medical): No  Physical Activity: Insufficiently Active (10/02/2020)   Exercise Vital Sign    Days of Exercise per Week: 2 days    Minutes of Exercise per Session: 60 min  Stress: No Stress Concern Present (10/02/2020)   [employer] - Occupational Stress Questionnaire    Feeling of Stress : Not at all  Social Connections: Moderately Isolated (10/02/2020)   Social Connection and Isolation Panel [NHANES]    Frequency of Communication with Friends and Family: More than three times a week    Frequency of Social Gatherings with Friends and Family: More than three times a week     Attends Religious Services: More than 4 times per year    Active Member of [employer] or Organizations: No    Attends [employer]er[employer] or Organization Meetings: Never    Marital Status: Widowed    Tobacco Counseling Counseling given: Not Answered   Clinical Intake:              How often do you need to have someone help you when you read instructions, pamphlets, or other written materials from your doctor or pharmacy?: (P) 1 - Never  Diabetic? No     Activities of Daily Living    10/01/2021   11:41 PM 09/30/2021    9:55 AM  In your present state of health, do you have any difficulty performing the following activities:  Hearing? 0 0   0  Vision? 0 0   0  Difficulty concentrating or making decisions? 0 0   0  Walking or climbing stairs? 0 0   0  Doing errands, shopping? 0 0   0  Preparing Food and eating ? N N   N  Using the Toilet? N N   N  In the past six months, have you accidently leaked urine? Y Y   Y  Do you have problems with loss of bowel control? N N   N  Managing your Medications? N N   N  Managing your Finances? N N   N  Housekeeping or managing your Housekeeping? Daryel Gerald   N    Patient Care Team: Anabel Halon, MD as PCP - General (Internal Medicine)  Indicate any recent Medical Services you may have received from other than [facility] providers in the past year (date may be approximate).     Assessment:   This is a routine wellness examination for Annesha.  Hearing/Vision screen No results found.  Dietary issues and exercise activities discussed:     Goals Addressed   None   Depression Screen    09/12/2021   10:43 AM 08/30/2021   11:02 AM 07/26/2021    8:58 AM 06/11/2021   10:42 AM 04/23/2021   10:59 AM 03/15/2021   10:56 AM 02/11/2021    9:59 AM  PHQ 2/9 Scores  PHQ - 2 Score 0 1 0 0 0 0 0    Fall Risk    10/01/2021   11:41 PM 09/30/2021    9:55 AM 09/12/2021   10:43 AM 08/30/2021   11:01 AM 07/26/2021    8:58 AM  Fall Risk   Falls in the past  year? 0 0   0 0 0 0  Number falls in past yr:   0 0 0  Injury with Fall?   0 0 0  Risk for fall due to :   No Fall Risks No Fall Risks No Fall Risks  Follow up   Falls evaluation completed Falls evaluation completed Falls evaluation completed    FALL RISK PREVENTION PERTAINING TO THE HOME:  Any stairs in or around the home? No  If so, are there any without handrails?  N/A Home free of loose throw rugs in walkways, pet beds, electrical cords, etc? Yes  Adequate lighting in your home to reduce risk of falls? Yes   ASSISTIVE DEVICES UTILIZED TO PREVENT FALLS:  Life alert? No  Use of a cane, walker or w/c? Yes  Grab bars in the bathroom? Yes  Shower chair or bench in shower? Yes  Elevated toilet seat or a handicapped toilet? Yes    Cognitive Function:    10/02/2020   10:20 AM  MMSE - Mini Mental State Exam  Not completed: Unable to complete        10/02/2020   10:20 AM  6CIT Screen  What Year? 0 points  What month? 0 points  What time? 0 points  Count back from 20 0 points  Months in reverse 0 points  Repeat phrase 0 points  Total Score 0 points    Immunizations Immunization History  Administered Date(s) Administered   Influenza-Unspecified 11/08/2013, 01/22/2021  Moderna SARS-COV2 Booster Vaccination 02/14/2020, 01/22/2021   Moderna Sars-Covid-2 Vaccination 05/10/2019, 06/07/2019   PNEUMOCOCCAL CONJUGATE-20 09/12/2021    TDAP status: Due, Education has been provided regarding the importance of this vaccine. Advised may receive this vaccine at local pharmacy or Health Dept. Aware to provide a copy of the vaccination record if obtained from local pharmacy or Health Dept. Verbalized acceptance and understanding.  Flu Vaccine status: Up to date  Pneumococcal vaccine status: Up to date  Covid-19 vaccine status: Completed vaccines  Qualifies for Shingles Vaccine? Yes   Zostavax completed No   Shingrix Completed?: No.    Education has been provided regarding the  importance of this vaccine. Patient has been advised to call insurance company to determine out of pocket expense if they have not yet received this vaccine. Advised may also receive vaccine at local pharmacy or Health Dept. Verbalized acceptance and understanding.  Screening Tests Health Maintenance  Topic Date Due   TETANUS/TDAP  Never done   Zoster Vaccines- Shingrix (1 of 2) Never done   COVID-19 Vaccine (3 - Moderna risk series) 02/19/2021   HEMOGLOBIN A1C  04/10/2021   URINE MICROALBUMIN  10/08/2021   INFLUENZA VACCINE  10/08/2021   FOOT EXAM  02/11/2022   OPHTHALMOLOGY EXAM  09/06/2022   MAMMOGRAM  07/16/2023   COLONOSCOPY (Pts 45-68yrs Insurance coverage will need to be confirmed)  02/20/2024   Pneumonia Vaccine 39+ Years old  Completed   DEXA SCAN  Completed   Hepatitis C Screening  Completed   HPV VACCINES  Aged Out    Health Maintenance  Health Maintenance Due  Topic Date Due   TETANUS/TDAP  Never done   Zoster Vaccines- Shingrix (1 of 2) Never done   COVID-19 Vaccine (3 - Moderna risk series) 02/19/2021   HEMOGLOBIN A1C  04/10/2021   URINE MICROALBUMIN  10/08/2021    Colorectal cancer screening: Type of screening: Colonoscopy. Completed 02/19/2017. Repeat every 10 years  Mammogram status: Completed 07/15/2021. Repeat every year  Bone Density status: Completed 12/03/2020. Results reflect: Bone density results: OSTEOPENIA. Repeat every 3 years.  Lung Cancer Screening: (Low Dose CT Chest recommended if Age 87-80 years, 30 pack-year currently smoking OR have quit w/in 15years.) does qualify.     Additional Screening:  Hepatitis C Screening: does qualify; Completed 02/08/2021  Vision Screening: Recommended annual ophthalmology exams for early detection of glaucoma and other disorders of the eye. Is the patient up to date with their annual eye exam?  Yes  Who is the provider or what is the name of the office in which the patient attends annual eye exams? [facility]  Dental Screening: Recommended annual dental exams for proper oral hygiene  Community Resource Referral / Chronic Care Management: CRR required this visit?  No   CCM required this visit?  No      Plan:     I have personally reviewed and noted the following in the patient's chart:   Medical and social history Use of alcohol, tobacco or illicit drugs  Current medications and supplements including opioid prescriptions.  Functional ability and status Nutritional status Physical activity Advanced directives List of other physicians Hospitalizations, surgeries, and ER visits in previous 12 months Vitals Screenings to include cognitive, depression, and falls Referrals and appointments  In addition, I have reviewed and discussed with patient certain preventive protocols, quality metrics, and best practice recommendations. A written personalized care plan for preventive services as well as general preventive health recommendations were provided to patient.  Telford Nab, CMA   10/04/2021   Nurse Notes:  Ms. Chanda , Thank you for taking time to come for your Medicare Wellness Visit. I appreciate your ongoing commitment to your health goals. Please review the following plan we discussed and let me know if I can assist you in the future.   These are the goals we discussed:  Goals      Increase physical activity     Would like to exercise more         This is a list of the screening recommended for you and due dates:  Health Maintenance  Topic Date Due   Tetanus Vaccine  Never done   Zoster (Shingles) Vaccine (1 of 2) Never done   COVID-19 Vaccine (3 - Moderna risk series) 02/19/2021   Hemoglobin A1C  04/10/2021   Urine Protein Check  10/08/2021   Flu Shot  10/08/2021   Complete foot exam   02/11/2022   Eye exam for diabetics  09/06/2022   Mammogram  07/16/2023   Colon Cancer Screening  02/20/2024   Pneumonia Vaccine  Completed   DEXA scan (bone  density measurement)  Completed   Hepatitis C Screening: USPSTF Recommendation to screen - Ages 56-79 yo.  Completed   HPV Vaccine  Aged Out

## 2021-10-04 NOTE — Patient Instructions (Signed)
Robin Bridges , Thank you for taking time to come for your Medicare Wellness Visit. I appreciate your ongoing commitment to your health goals. Please review the following plan we discussed and let me know if I can assist you in the future.   Screening recommendations/referrals: Colonoscopy: Completed Mammogram: Completed Bone Density: Completed Recommended yearly ophthalmology/optometry visit for glaucoma screening and checkup Recommended yearly dental visit for hygiene and checkup  Vaccinations: Influenza vaccine: Completed Pneumococcal vaccine: Completed Tdap vaccine: Due Shingles vaccine: Get at your pharmacy    Advanced directives: Patient declined  Conditions/risks identified: falls, hypertension  Next appointment: 1 year   Preventive Care 12 Years and Older, Female Preventive care refers to lifestyle choices and visits with your health care provider that can promote health and wellness. What does preventive care include? A yearly physical exam. This is also called an annual well check. Dental exams once or twice a year. Routine eye exams. Ask your health care provider how often you should have your eyes checked. Personal lifestyle choices, including: Daily care of your teeth and gums. Regular physical activity. Eating a healthy diet. Avoiding tobacco and drug use. Limiting alcohol use. Practicing safe sex. Taking low-dose aspirin every day. Taking vitamin and mineral supplements as recommended by your health care provider. What happens during an annual well check? The services and screenings done by your health care provider during your annual well check will depend on your age, overall health, lifestyle risk factors, and family history of disease. Counseling  Your health care provider may ask you questions about your: Alcohol use. Tobacco use. Drug use. Emotional well-being. Home and relationship well-being. Sexual activity. Eating habits. History of  falls. Memory and ability to understand (cognition). Work and work Astronomer. Reproductive health. Screening  You may have the following tests or measurements: Height, weight, and BMI. Blood pressure. Lipid and cholesterol levels. These may be checked every 5 years, or more frequently if you are over 97 years old. Skin check. Lung cancer screening. You may have this screening every year starting at age 98 if you have a 30-pack-year history of smoking and currently smoke or have quit within the past 15 years. Fecal occult blood test (FOBT) of the stool. You may have this test every year starting at age 43. Flexible sigmoidoscopy or colonoscopy. You may have a sigmoidoscopy every 5 years or a colonoscopy every 10 years starting at age 22. Hepatitis C blood test. Hepatitis B blood test. Sexually transmitted disease (STD) testing. Diabetes screening. This is done by checking your blood sugar (glucose) after you have not eaten for a while (fasting). You may have this done every 1-3 years. Bone density scan. This is done to screen for osteoporosis. You may have this done starting at age 50. Mammogram. This may be done every 1-2 years. Talk to your health care provider about how often you should have regular mammograms. Talk with your health care provider about your test results, treatment options, and if necessary, the need for more tests. Vaccines  Your health care provider may recommend certain vaccines, such as: Influenza vaccine. This is recommended every year. Tetanus, diphtheria, and acellular pertussis (Tdap, Td) vaccine. You may need a Td booster every 10 years. Zoster vaccine. You may need this after age 61. Pneumococcal 13-valent conjugate (PCV13) vaccine. One dose is recommended after age 45. Pneumococcal polysaccharide (PPSV23) vaccine. One dose is recommended after age 35. Talk to your health care provider about which screenings and vaccines you need and how often you  need  them. This information is not intended to replace advice given to you by your health care provider. Make sure you discuss any questions you have with your health care provider. Document Released: 03/23/2015 Document Revised: 11/14/2015 Document Reviewed: 12/26/2014 Elsevier Interactive Patient Education  2017 [employer] Prevention in the Home Falls can cause injuries. They can happen to people of all ages. There are many things you can do to make your home safe and to help prevent falls. What can I do on the outside of my home? Regularly fix the edges of walkways and driveways and fix any cracks. Remove anything that might make you trip as you walk through a door, such as a raised step or threshold. Trim any bushes or trees on the path to your home. Use bright outdoor lighting. Clear any walking paths of anything that might make someone trip, such as rocks or tools. Regularly check to see if handrails are loose or broken. Make sure that both sides of any steps have handrails. Any raised decks and porches should have guardrails on the edges. Have any leaves, snow, or ice cleared regularly. Use sand or salt on walking paths during winter. Clean up any spills in your garage right away. This includes oil or grease spills. What can I do in the bathroom? Use night lights. Install grab bars by the toilet and in the tub and shower. Do not use towel bars as grab bars. Use non-skid mats or decals in the tub or shower. If you need to sit down in the shower, use a plastic, non-slip stool. Keep the floor dry. Clean up any water that spills on the floor as soon as it happens. Remove soap buildup in the tub or shower regularly. Attach bath mats securely with double-sided non-slip rug tape. Do not have throw rugs and other things on the floor that can make you trip. What can I do in the bedroom? Use night lights. Make sure that you have a light by your bed that is easy to reach. Do not use  any sheets or blankets that are too big for your bed. They should not hang down onto the floor. Have a firm chair that has side arms. You can use this for support while you get dressed. Do not have throw rugs and other things on the floor that can make you trip. What can I do in the kitchen? Clean up any spills right away. Avoid walking on wet floors. Keep items that you use a lot in easy-to-reach places. If you need to reach something above you, use a strong step stool that has a grab bar. Keep electrical cords out of the way. Do not use floor polish or wax that makes floors slippery. If you must use wax, use non-skid floor wax. Do not have throw rugs and other things on the floor that can make you trip. What can I do with my stairs? Do not leave any items on the stairs. Make sure that there are handrails on both sides of the stairs and use them. Fix handrails that are broken or loose. Make sure that handrails are as long as the stairways. Check any carpeting to make sure that it is firmly attached to the stairs. Fix any carpet that is loose or worn. Avoid having throw rugs at the top or bottom of the stairs. If you do have throw rugs, attach them to the floor with carpet tape. Make sure that you have a light switch  at the top of the stairs and the bottom of the stairs. If you do not have them, ask someone to add them for you. What else can I do to help prevent falls? Wear shoes that: Do not have high heels. Have rubber bottoms. Are comfortable and fit you well. Are closed at the toe. Do not wear sandals. If you use a stepladder: Make sure that it is fully opened. Do not climb a closed stepladder. Make sure that both sides of the stepladder are locked into place. Ask someone to hold it for you, if possible. Clearly mark and make sure that you can see: Any grab bars or handrails. First and last steps. Where the edge of each step is. Use tools that help you move around (mobility aids)  if they are needed. These include: Canes. Walkers. Scooters. Crutches. Turn on the lights when you go into a dark area. Replace any light bulbs as soon as they burn out. Set up your furniture so you have a clear path. Avoid moving your furniture around. If any of your floors are uneven, fix them. If there are any pets around you, be aware of where they are. Review your medicines with your doctor. Some medicines can make you feel dizzy. This can increase your chance of falling. Ask your doctor what other things that you can do to help prevent falls. This information is not intended to replace advice given to you by your health care provider. Make sure you discuss any questions you have with your health care provider. Document Released: 12/21/2008 Document Revised: 08/02/2015 Document Reviewed: 03/31/2014 Elsevier Interactive Patient Education  2017 [employer].

## 2021-10-07 ENCOUNTER — Ambulatory Visit: Payer: Medicare PPO | Admitting: Internal Medicine

## 2021-10-07 ENCOUNTER — Encounter: Payer: Self-pay | Admitting: Internal Medicine

## 2021-10-07 DIAGNOSIS — J449 Chronic obstructive pulmonary disease, unspecified: Secondary | ICD-10-CM | POA: Diagnosis not present

## 2021-10-07 NOTE — Patient Instructions (Signed)
Work on inhaler technique:  relax and gently blow all the way out then take a nice smooth full deep breath back in, triggering the inhaler at same time you start breathing in.  Hold breath in for at least  5 seconds if you can. Blow out breztri  thru nose. Rinse and gargle with water when done.  If mouth or throat bother you at all,  try brushing teeth/gums/tongue with arm and hammer toothpaste/ make a slurry and gargle and spit out.   Only use your albuterol as a rescue medication to be used if you can't catch your breath by resting or doing a relaxed purse lip breathing pattern.  - The less you use it, the better it will work when you need it. - Ok to use up to 2 puffs  every 4 hours if you must but call for immediate appointment if use goes up over your usual need - Don't leave home without it !!  (think of it like the spare tire for your car)   Ok to try albuterol 15 min before an activity (on alternating days)  that you know would usually make you short of breath and see if it makes any difference and if makes none then don't take albuterol after activity unless you can't catch your breath as this means it's the resting that helps, not the albuterol.      Please schedule a follow up visit in 12  months but call sooner if needed

## 2021-10-07 NOTE — Progress Notes (Unsigned)
Robin Bridges, female    DOB: [birth year]  MRN: [id number]   Brief patient profile:  77   yobf  quit smoking Aug 2022 with onset of cough > sob at death of husband 07-31-2018 referred to pulmonary clinic in [place]  06/27/2021 by Dr Allena Katz for   ? Copd with 20 lb wt gain since stopped     History of Present Illness  06/27/2021  Pulmonary/ 1st office eval/ Robin Bridges / [facility]  Chief Complaint  Patient presents with   Consult    Ref by Dr. Allena Katz for ongoing bronchitis/cough/sob  Patient believes she could have asthma. She states the pollen and strong perfume smells set off her coughing and SOB   Dyspnea:  walk  better p rx  breztri / still  doe vacuuming / shopping is  ok  Cough: typically p supper  but not really taking breztri 2 q 12 and seems better p pm breztri when remembers to take it / sputum mucoid  Sleep: ok hs  SABA use: twice weekly Rec Plan A = Automatic = Always=  continue  Breztri Take 2 puffs first thing in am and then another 2 puffs about 12 hours later.   Work on inhaler technique:   -  remember how a golfer takes practice swings  Plan B = Backup (to supplement plan A, not to replace it) Only use your albuterol inhaler as a rescue medication   Please schedule a follow up visit in 3 months but call sooner if needed with pfts in meantime   10/07/2021  f/u ov/[place] office/Robin Bridges re: AB maint on breztri 2bid   Chief Complaint  Patient presents with   Follow-up    Breathing is about the same since last ov. Heat and humidity bothering patient.    Dyspnea:  vacuuming still req albuterol to get thru Cough: none  Sleeping:  flat bed/ 3 pillows  SABA use: maybe once a day, none at hs  02: none  Covid status: vax x 4  Lung cancer screening: CT chest  05/14/21    No obvious day to day or daytime variability or assoc excess/ purulent sputum or mucus plugs or hemoptysis or cp or chest tightness, subjective wheeze or overt sinus or hb symptoms.   Sleeping  without  nocturnal  or early am exacerbation  of respiratory  c/o's or need for noct saba. Also denies any obvious fluctuation of symptoms with weather or environmental changes or other aggravating or alleviating factors except as outlined above   No unusual exposure hx or h/o childhood pna/ asthma or knowledge of premature birth.  Current Allergies, Complete Past Medical History, Past Surgical History, Family History, and Social History were reviewed in [employer] record.  ROS  The following are not active complaints unless bolded Hoarseness, sore throat, dysphagia, dental problems, itching, sneezing,  nasal congestion or discharge of excess mucus or purulent secretions, ear ache,   fever, chills, sweats, unintended wt loss or wt gain, classically pleuritic or exertional cp,  orthopnea pnd or arm/hand swelling  or leg swelling, presyncope, palpitations, abdominal pain, anorexia, nausea, vomiting, diarrhea  or change in bowel habits or change in bladder habits, change in stools or change in urine, dysuria, hematuria,  rash, arthralgias, visual complaints, headache, numbness, weakness or ataxia or problems with walking or coordination,  change in mood or  memory.        Current Meds  Medication Sig   albuterol (VENTOLIN HFA) 108 (  90 Base) MCG/ACT inhaler Inhale 2 puffs into the lungs every 6 (six) hours as needed for wheezing or shortness of breath.   ALPRAZolam (XANAX) 0.5 MG tablet TAKE 1 TABLET BY MOUTH TWICE DAILY AS NEEDED FOR ANXIETY   amLODipine (NORVASC) 10 MG tablet Take 1 tablet (10 mg total) by mouth daily.   aspirin EC 81 MG tablet Take 81 mg by mouth daily.   atorvastatin (LIPITOR) 10 MG tablet Take 1 tablet (10 mg total) by mouth daily.   benzonatate (TESSALON) 100 MG capsule Take 1 capsule (100 mg total) by mouth 2 (two) times daily as needed for cough.   Budeson-Glycopyrrol-Formoterol (BREZTRI AEROSPHERE) 160-9-4.8 MCG/ACT AERO Inhale 2 puffs into the lungs 2 (two)  times daily.   fluticasone (FLONASE) 50 MCG/ACT nasal spray Place 2 sprays into both nostrils daily.   hydrochlorothiazide (MICROZIDE) 12.5 MG capsule TAKE ONE CAPSULE BY MOUTH EVERY DAY   meclizine (ANTIVERT) 12.5 MG tablet Take by mouth.   montelukast (SINGULAIR) 10 MG tablet Take 1 tablet (10 mg total) by mouth at bedtime.   naproxen (NAPROSYN) 500 MG tablet Take 1 tablet (500 mg total) by mouth 2 (two) times daily with a meal.   pantoprazole (PROTONIX) 40 MG tablet Take 1 tablet (40 mg total) by mouth daily.   prochlorperazine (COMPAZINE) 5 MG tablet Take 5 mg by mouth every 6 (six) hours as needed for nausea or vomiting.   tacrolimus (PROTOPIC) 0.1 % ointment Apply topically 2 (two) times daily.   venlafaxine XR (EFFEXOR-XR) 37.5 MG 24 hr capsule Take 1 capsule (37.5 mg total) by mouth daily with breakfast. Take one capsule by mouth every day                     Past Medical History:  Diagnosis Date   Hypertension         Objective:     Wt Readings from Last 3 Encounters:  10/07/21 158 lb 9.6 oz (71.9 kg)  09/12/21 157 lb 9.6 oz (71.5 kg)  08/06/21 154 lb (69.9 kg)      Vital signs reviewed  10/07/2021  - Note at rest 02 sats  95% on RA   General appearance:    amb bf nad   HEENT : Oropharynx  clear     Nasal turbinates nl    NECK :  without  apparent JVD/ palpable Nodes/TM    LUNGS: no acc muscle use,  Nl contour chest which is clear to A and P bilaterally without cough on insp or exp maneuvers   CV:  RRR  no s3 or murmur or increase in P2, and no edema   ABD:  soft and nontender with nl inspiratory excursion in the supine position. No bruits or organomegaly appreciated   MS:  Nl gait/ ext warm without deformities Or obvious joint restrictions  calf tenderness, cyanosis or clubbing    SKIN: warm and dry without lesions    NEURO:  alert, approp, nl sensorium with  no motor or cerebellar deficits apparent.           Assessment

## 2021-10-08 ENCOUNTER — Encounter: Payer: Self-pay | Admitting: Internal Medicine

## 2021-10-08 NOTE — Assessment & Plan Note (Signed)
Onset around 2020 p husband's death with 20 lb wt gain  - Stopped smoking 10/2020 at wt 151  - 06/27/2021  After extensive coaching inhaler device,  effectiveness =    50% at best > continue breztri pending pfts  - 06/27/2021   Walked on RA  x  3  lap(s) =  approx 450  ft  @ fast pace, stopped due to end of study, onset  sob on 2nd lap  with lowest 02 sats 95% - PFT's  07/01/21   FEV1 1.44 (83 % ) ratio 0.81  p 21 % improvement from saba p breztri prior to study with DLCO  Nl  And FV curve concave and ERV 16%  at wt 155    - 10/07/2021  After extensive coaching inhaler device,  effectiveness =  75%  All goals of chronic asthma control met including optimal function and elimination of symptoms with minimal need for rescue therapy.  Contingencies discussed in full including contacting this office immediately if not controlling the symptoms using the rule of two's.     No change rx needed / f/u yearly           Each maintenance medication was reviewed in detail including emphasizing most importantly the difference between maintenance and prns and under what circumstances the prns are to be triggered using an action plan format where appropriate.  Total time for H and P, chart review, counseling, reviewing hfa device(s) and generating customized AVS unique to this office visit / same day charting = 38mn

## 2021-10-26 ENCOUNTER — Other Ambulatory Visit: Payer: Self-pay | Admitting: Internal Medicine

## 2021-10-26 DIAGNOSIS — E782 Mixed hyperlipidemia: Secondary | ICD-10-CM

## 2021-10-30 LAB — HM DIABETES EYE EXAM

## 2021-11-06 ENCOUNTER — Ambulatory Visit: Payer: Medicare PPO | Admitting: Orthopedic Surgery

## 2021-11-19 ENCOUNTER — Encounter: Payer: Self-pay | Admitting: Internal Medicine

## 2021-11-19 ENCOUNTER — Ambulatory Visit: Payer: Medicare PPO | Admitting: Internal Medicine

## 2021-11-19 DIAGNOSIS — J441 Chronic obstructive pulmonary disease with (acute) exacerbation: Secondary | ICD-10-CM

## 2021-11-19 DIAGNOSIS — J0110 Acute frontal sinusitis, unspecified: Secondary | ICD-10-CM

## 2021-11-19 MED ORDER — METHYLPREDNISOLONE 4 MG PO TBPK: ORAL_TABLET | ORAL | 0 refills | Status: DC

## 2021-11-19 MED ORDER — AZITHROMYCIN 250 MG PO TABS: ORAL_TABLET | ORAL | 0 refills | Status: AC

## 2021-11-19 NOTE — Progress Notes (Signed)
Virtual Visit via Telephone Note   This visit type was conducted due to national recommendations for restrictions regarding the COVID-19 Pandemic (e.g. social distancing) in an effort to limit this patient's exposure and mitigate transmission in our community.  Due to her co-morbid illnesses, this patient is at least at moderate risk for complications without adequate follow up.  This format is felt to be most appropriate for this patient at this time.  The patient did not have access to video technology/had technical difficulties with video requiring transitioning to audio format only (telephone).  All issues noted in this document were discussed and addressed.  No physical exam could be performed with this format.  Evaluation Performed:  Follow-up visit  Date:  11/19/2021   ID:  Robin Bridges, DOB 1945/06/12, MRN [id number]  Patient Location: Home Provider Location: Office/Clinic  Participants: Patient Location of Patient: Home Location of Provider: Telehealth Consent was obtain for visit to be over via telehealth. I verified that I am speaking with the correct person using two identifiers.  PCP:  Anabel Halon, MD   Chief Complaint: Cough, fever and nasal congestion  History of Present Illness:    Robin Bridges is a 76 y.o. female who has a televisit for c/o cough, fever and nasal congestion for the last 4 days. She also reports mild worsening of her dyspnea. She had negative COVID test at home. She denies any hemoptysis. She has tried taking Mucinex with some relief.  The patient does not have symptoms concerning for COVID-19 infection (fever, chills, cough, or new shortness of breath).   Past Medical, Surgical, Social History, Allergies, and Medications have been Reviewed.  Past Medical History:  Diagnosis Date   Anxiety    Arthritis    Asthma    Blood transfusion without reported diagnosis    COPD (chronic obstructive pulmonary disease) (HCC)    GERD  (gastroesophageal reflux disease)    Hypertension    Neuromuscular disorder (HCC)    Osteoporosis    Stroke (HCC)    Ulcer    Past Surgical History:  Procedure Laterality Date   ABDOMINAL HYSTERECTOMY     COLONOSCOPY N/A 02/19/2017   Procedure: COLONOSCOPY;  Surgeon: Malissa Hippo, MD;  Location: [facility];  Service: Endoscopy;  Laterality: N/A;  830   EYE SURGERY     FRACTURE SURGERY     TUBAL LIGATION       Current Meds  Medication Sig   albuterol (VENTOLIN HFA) 108 (90 Base) MCG/ACT inhaler Inhale 2 puffs into the lungs every 6 (six) hours as needed for wheezing or shortness of breath.   ALPRAZolam (XANAX) 0.5 MG tablet TAKE 1 TABLET BY MOUTH TWICE DAILY AS NEEDED FOR ANXIETY   amLODipine (NORVASC) 10 MG tablet Take 1 tablet (10 mg total) by mouth daily.   aspirin EC 81 MG tablet Take 81 mg by mouth daily.   atorvastatin (LIPITOR) 10 MG tablet TAKE 1 TABLET BY MOUTH DAILY   benzonatate (TESSALON) 100 MG capsule Take 1 capsule (100 mg total) by mouth 2 (two) times daily as needed for cough.   Budeson-Glycopyrrol-Formoterol (BREZTRI AEROSPHERE) 160-9-4.8 MCG/ACT AERO Inhale 2 puffs into the lungs 2 (two) times daily.   fluticasone (FLONASE) 50 MCG/ACT nasal spray Place 2 sprays into both nostrils daily.   hydrochlorothiazide (MICROZIDE) 12.5 MG capsule TAKE ONE CAPSULE BY MOUTH EVERY DAY   meclizine (ANTIVERT) 12.5 MG tablet Take by mouth.   montelukast (SINGULAIR) 10 MG tablet  Take 1 tablet (10 mg total) by mouth at bedtime.   naproxen (NAPROSYN) 500 MG tablet Take 1 tablet (500 mg total) by mouth 2 (two) times daily with a meal.   pantoprazole (PROTONIX) 40 MG tablet Take 1 tablet (40 mg total) by mouth daily.   prochlorperazine (COMPAZINE) 5 MG tablet Take 5 mg by mouth every 6 (six) hours as needed for nausea or vomiting.   tacrolimus (PROTOPIC) 0.1 % ointment Apply topically 2 (two) times daily.   venlafaxine XR (EFFEXOR-XR) 37.5 MG 24 hr capsule Take 1 capsule (37.5 mg  total) by mouth daily with breakfast. Take one capsule by mouth every day     Allergies:   Patient has no known allergies.   ROS:   Please see the history of present illness.     All other systems reviewed and are negative.   Labs/Other Tests and Data Reviewed:    Recent Labs: 02/08/2021: ALT 36; BUN 16; Creatinine, Ser 0.90; Hemoglobin 14.0; Platelets 295; Potassium 4.3; Sodium 140   Recent Lipid Panel Lab Results  Component Value Date/Time   CHOL 235 (H) 02/08/2021 10:26 AM   TRIG 171 (H) 02/08/2021 10:26 AM   HDL 60 02/08/2021 10:26 AM   CHOLHDL 3.9 02/08/2021 10:26 AM   LDLCALC 145 (H) 02/08/2021 10:26 AM    Wt Readings from Last 3 Encounters:  10/07/21 158 lb 9.6 oz (71.9 kg)  09/12/21 157 lb 9.6 oz (71.5 kg)  08/06/21 154 lb (69.9 kg)     ASSESSMENT & PLAN:    Acute sinusitis Started azithromycin considering possible COPD exacerbation Continue Mucinex for cough Nasal saline spray as needed for nasal congestion Singulair for allergies  COPD exacerbation Azithromycin and Medrol Dosepak Continue Breztri Albuterol as needed for dyspnea and wheezing  Time:   Today, I have spent 14 minutes reviewing the chart, including problem list, medications, and with the patient with telehealth technology discussing the above problems.   Medication Adjustments/Labs and Tests Ordered: Current medicines are reviewed at length with the patient today.  Concerns regarding medicines are outlined above.   Tests Ordered: No orders of the defined types were placed in this encounter.   Medication Changes: No orders of the defined types were placed in this encounter.    Note: This dictation was prepared with Dragon dictation along with smaller phrase technology. Similar sounding words can be transcribed inadequately or may not be corrected upon review. Any transcriptional errors that result from this process are unintentional.      Disposition:  Follow up  Signed, Anabel Halon, MD  11/19/2021 9:22 AM     [facility]

## 2021-11-25 ENCOUNTER — Encounter: Payer: Self-pay | Admitting: Orthopedic Surgery

## 2021-11-25 ENCOUNTER — Ambulatory Visit: Payer: Medicare PPO | Admitting: Orthopedic Surgery

## 2021-11-25 DIAGNOSIS — M7521 Bicipital tendinitis, right shoulder: Secondary | ICD-10-CM | POA: Diagnosis not present

## 2021-11-25 MED ORDER — METHYLPREDNISOLONE ACETATE 40 MG/ML IJ SUSP
40.0000 mg | Freq: Once | INTRAMUSCULAR | Status: AC
  Administered 2021-11-25: 40 mg via INTRA_ARTICULAR

## 2021-11-25 NOTE — Patient Instructions (Signed)
Use Aspercreme, Biofreeze or Voltaren gel over the counter 2-3 times daily make sure you rub it in well each time you use it.   

## 2021-11-25 NOTE — Progress Notes (Signed)
Chief Complaint  Patient presents with   Shoulder Pain    Right/     HPI: 76 year old female with atraumatic pain anterior aspect of right shoulder.  Dr. Santiago Glad saw her few months ago and she received a cortisone injection in the subacromial space, she was placed on home exercise program and she returns for follow-up visit  She reports no improvement in her current pain.  The pain is in the front of the arm and shoulder radiates down to the elbow  Past Medical History:  Diagnosis Date   Anxiety    Arthritis    Asthma    Blood transfusion without reported diagnosis    COPD (chronic obstructive pulmonary disease) ([place])    GERD (gastroesophageal reflux disease)    Hypertension    Neuromuscular disorder ([employer])    Osteoporosis    Stroke ([place])    Ulcer     There were no vitals taken for this visit.   General appearance: Well-developed well-nourished no gross deformities  Cardiovascular normal pulse and perfusion normal color without edema  Neurologically no sensation loss or deficits or pathologic reflexes  Psychological: Awake alert and oriented x3 mood and affect normal  Skin no lacerations or ulcerations no nodularity no palpable masses, no erythema or nodularity  Musculoskeletal: Tenderness over the right bicipital groove and biceps tendon.  Supraspinatus strength is normal abduction strength is normal although there is some pain but it all seems to be in the front of the shoulder  Imaging the x-ray that was taken does not show any osteoarthritis  A/P  Suspect bicipital tendinitis with mild rotator cuff tendinitis  Recommend anterior injection at the point of maximal tenderness in the bicipital groove  Continue with Tylenol as the patient has history of bleeding ulcer  Recommend topical medication as well.  Injection was given  Patient will return in 4 weeks  Procedure: Biceps tendon injection sheath/ligament Right biceps tendon was injected The patient gave  verbal consent for cortisone injection Timeout confirmed the site of injection Medications used included 40 mg of Depo-Medrol and 3 mL 1% lidocaine After alcohol and ethyl chloride preparation the point of maximal tenderness was injected over the right biceps tendon there were no complications

## 2021-12-22 DIAGNOSIS — Z20822 Contact with and (suspected) exposure to covid-19: Secondary | ICD-10-CM | POA: Diagnosis not present

## 2021-12-22 DIAGNOSIS — J069 Acute upper respiratory infection, unspecified: Secondary | ICD-10-CM | POA: Diagnosis not present

## 2021-12-23 ENCOUNTER — Ambulatory Visit (INDEPENDENT_AMBULATORY_CARE_PROVIDER_SITE_OTHER): Payer: Medicare PPO

## 2021-12-23 ENCOUNTER — Ambulatory Visit: Payer: Medicare PPO | Admitting: Orthopedic Surgery

## 2021-12-23 ENCOUNTER — Encounter: Payer: Self-pay | Admitting: Orthopedic Surgery

## 2021-12-23 DIAGNOSIS — G5601 Carpal tunnel syndrome, right upper limb: Secondary | ICD-10-CM | POA: Diagnosis not present

## 2021-12-23 DIAGNOSIS — Z9889 Other specified postprocedural states: Secondary | ICD-10-CM | POA: Diagnosis not present

## 2021-12-23 DIAGNOSIS — M792 Neuralgia and neuritis, unspecified: Secondary | ICD-10-CM | POA: Diagnosis not present

## 2021-12-23 DIAGNOSIS — M7521 Bicipital tendinitis, right shoulder: Secondary | ICD-10-CM | POA: Diagnosis not present

## 2021-12-23 DIAGNOSIS — M25511 Pain in right shoulder: Secondary | ICD-10-CM

## 2021-12-23 DIAGNOSIS — M7581 Other shoulder lesions, right shoulder: Secondary | ICD-10-CM

## 2021-12-23 DIAGNOSIS — G8929 Other chronic pain: Secondary | ICD-10-CM | POA: Diagnosis not present

## 2021-12-23 NOTE — Patient Instructions (Signed)
While we are working on your approval for MRI please go ahead and call to schedule your appointment with [facility] within at least one (1) week.   Central Scheduling [phone]  

## 2021-12-23 NOTE — Progress Notes (Signed)
FOLLOW UP   Encounter Diagnoses  Name Primary?   Biceps tendinitis of right upper extremity Yes   Tendinitis of right rotator cuff      Chief Complaint  Patient presents with   Shoulder Pain    RT SHOULDER/FOLLOW UP THE SAME    76 year old female for follow-up she has a complaint of anterior shoulder pain with no history of trauma.  She saw my associate Dr. Rosey Bath a few months ago saw me in September she had a cortisone injection subacromial space home exercise program she had an anterior biceps tendon injection she has been on Tylenol because of the bleeding ulcer  She has not improved her x-ray does not show any arthritis  She has a history of carpal tunnel release she is having some tingling in her fingers which is worse at night  She can move her arm out to the side well but has trouble with forward elevation  Cervical spine imaging   Encounter Diagnoses  Name Primary?   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

## 2022-01-13 ENCOUNTER — Encounter: Payer: Self-pay | Admitting: Internal Medicine

## 2022-01-13 ENCOUNTER — Ambulatory Visit: Payer: Medicare PPO | Admitting: Internal Medicine

## 2022-01-13 VITALS — BP 154/90 | HR 88 | Ht 59.0 in | Wt 162.2 lb

## 2022-01-13 DIAGNOSIS — I10 Essential (primary) hypertension: Secondary | ICD-10-CM

## 2022-01-13 DIAGNOSIS — J449 Chronic obstructive pulmonary disease, unspecified: Secondary | ICD-10-CM

## 2022-01-13 DIAGNOSIS — F411 Generalized anxiety disorder: Secondary | ICD-10-CM

## 2022-01-13 DIAGNOSIS — R7303 Prediabetes: Secondary | ICD-10-CM | POA: Diagnosis not present

## 2022-01-13 DIAGNOSIS — F330 Major depressive disorder, recurrent, mild: Secondary | ICD-10-CM

## 2022-01-13 DIAGNOSIS — F331 Major depressive disorder, recurrent, moderate: Secondary | ICD-10-CM | POA: Insufficient documentation

## 2022-01-13 DIAGNOSIS — E782 Mixed hyperlipidemia: Secondary | ICD-10-CM

## 2022-01-13 DIAGNOSIS — J209 Acute bronchitis, unspecified: Secondary | ICD-10-CM

## 2022-01-13 HISTORY — DX: Major depressive disorder, recurrent, mild: F33.0

## 2022-01-13 MED ORDER — METHYLPREDNISOLONE 4 MG PO TBPK: ORAL_TABLET | ORAL | 0 refills | Status: DC

## 2022-01-13 MED ORDER — VENLAFAXINE HCL ER 75 MG PO CP24: 75.0000 mg | ORAL_CAPSULE | Freq: Every day | ORAL | 0 refills | Status: DC

## 2022-01-13 MED ORDER — METHYLPREDNISOLONE ACETATE 80 MG/ML IJ SUSP
40.0000 mg | Freq: Once | INTRAMUSCULAR | Status: AC
  Administered 2022-01-13: 40 mg via INTRAMUSCULAR

## 2022-01-13 NOTE — Assessment & Plan Note (Addendum)
Solu-Medrol 40 mg IM today Started Medrol dosepak Continue Mucinex or Robitussin PRN for cough Tessalon as needed for cough Advised to contact if persistent or worsening symptoms

## 2022-01-13 NOTE — Progress Notes (Signed)
Established Patient Office Visit  Subjective:  Patient ID: Robin Bridges, female    DOB: Jul 12, 1945  Age: 76 y.o. MRN: [id number]  CC:  Chief Complaint  Patient presents with   Follow-up   Bronchitis    Bronchitis symptoms since 01/12/22 losing voice productive cough    HPI Robin Bridges is a 76 y.o. female with past medical history of HTN, HLD, COPD, prediabetes, GERD, anxiety and tobacco abuse who presents for f/u of her chronic medical conditions.  She complains of chronic cough, which has been worse for the last 1 week.  She also reports dyspnea and wheezing, which are intermittent.  She has been using albuterol neb with some relief of her dyspnea.  She has been using Breztri regularly and uses albuterol as needed for dyspnea or wheezing.  She denies any fever or chills.  BP is elevated today. Takes medications regularly. Patient denies headache, dizziness, chest pain or palpitations.  GAD: She has been taking Effexor and as needed Xanax.  She has been feeling more anxiety lately as she is overwhelmed taking care of great-grandchildren.  She has had conflicts with her granddaughter.  She reports anhedonia, agitation and insomnia.  She has not been watching over her diet and has been gaining weight as well.     Past Medical History:  Diagnosis Date   Anxiety    Arthritis    Asthma    Blood transfusion without reported diagnosis    COPD (chronic obstructive pulmonary disease) ([facility])    GERD (gastroesophageal reflux disease)    Hypertension    Mild episode of recurrent major depressive disorder ([place]) 01/13/2022   Neuromuscular disorder ([place])    Osteoporosis    Stroke ([place])    Ulcer     Past Surgical History:  Procedure Laterality Date   ABDOMINAL HYSTERECTOMY     COLONOSCOPY N/A 02/19/2017   Procedure: COLONOSCOPY;  Surgeon: Rogene Houston, MD;  Location: [facility];  Service: Endoscopy;  Laterality: N/A;  830   EYE SURGERY     FRACTURE SURGERY     TUBAL LIGATION       Family History  Problem Relation Age of Onset   Breast cancer Maternal Aunt     Social History   Socioeconomic History   Marital status: Single    Spouse name: Not on file   Number of children: Not on file   Years of education: Not on file   Highest education level: Not on file  Occupational History   Not on file  Tobacco Use   Smoking status: Former    Types: Cigars   Smokeless tobacco: Never  Substance and Sexual Activity   Alcohol use: Yes    Alcohol/week: 1.0 standard drink of alcohol    Types: 1 Cans of beer per week    Comment: some days   Drug use: Yes    Types: Marijuana    Comment: here and there for pain   Sexual activity: Not on file  Other Topics Concern   Not on file  Social History Narrative   Not on file   Social Determinants of Health   Financial Resource Strain: Low Risk  (10/04/2021)   Overall Financial Resource Strain (CARDIA)    Difficulty of Paying Living Expenses: Not hard at all  Food Insecurity: No Food Insecurity (10/04/2021)   Hunger Vital Sign    Worried About Running Out of Food in the Last Year: Never true    Ran Out of  Food in the Last Year: Never true  Transportation Needs: No Transportation Needs (10/04/2021)   PRAPARE - Hydrologist (Medical): No    Lack of Transportation (Non-Medical): No  Physical Activity: Insufficiently Active (10/04/2021)   Exercise Vital Sign    Days of Exercise per Week: 2 days    Minutes of Exercise per Session: 30 min  Stress: No Stress Concern Present (10/04/2021)   [place]    Feeling of Stress : Not at all  Social Connections: Moderately Isolated (10/04/2021)   Social Connection and Isolation Panel [NHANES]    Frequency of Communication with Friends and Family: More than three times a week    Frequency of Social Gatherings with Friends and Family: Three times a week    Attends Religious Services: More  than 4 times per year    Active Member of Clubs or Organizations: No    Attends Archivist Meetings: Never    Marital Status: Widowed  Intimate Partner Violence: Not At Risk (10/04/2021)   Humiliation, Afraid, Rape, and Kick questionnaire    Fear of Current or Ex-Partner: No    Emotionally Abused: No    Physically Abused: No    Sexually Abused: No    Outpatient Medications Prior to Visit  Medication Sig Dispense Refill   albuterol (VENTOLIN HFA) 108 (90 Base) MCG/ACT inhaler Inhale 2 puffs into the lungs every 6 (six) hours as needed for wheezing or shortness of breath. 8 g 4   ALPRAZolam (XANAX) 0.5 MG tablet TAKE 1 TABLET BY MOUTH TWICE DAILY AS NEEDED FOR ANXIETY 60 tablet 4   amLODipine (NORVASC) 10 MG tablet Take 1 tablet (10 mg total) by mouth daily. 90 tablet 3   aspirin EC 81 MG tablet Take 81 mg by mouth daily.     atorvastatin (LIPITOR) 10 MG tablet TAKE 1 TABLET BY MOUTH DAILY 90 tablet 1   benzonatate (TESSALON) 100 MG capsule Take 1 capsule (100 mg total) by mouth 2 (two) times daily as needed for cough. 20 capsule 0   Budeson-Glycopyrrol-Formoterol (BREZTRI AEROSPHERE) 160-9-4.8 MCG/ACT AERO Inhale 2 puffs into the lungs 2 (two) times daily. 10.7 g 11   fluticasone (FLONASE) 50 MCG/ACT nasal spray Place 2 sprays into both nostrils daily. 16 g 6   hydrochlorothiazide (MICROZIDE) 12.5 MG capsule TAKE ONE CAPSULE BY MOUTH EVERY DAY 90 capsule 1   meclizine (ANTIVERT) 12.5 MG tablet Take by mouth.     montelukast (SINGULAIR) 10 MG tablet Take 1 tablet (10 mg total) by mouth at bedtime. 90 tablet 3   naproxen (NAPROSYN) 500 MG tablet Take 1 tablet (500 mg total) by mouth 2 (two) times daily with a meal. 30 tablet 1   pantoprazole (PROTONIX) 40 MG tablet Take 1 tablet (40 mg total) by mouth daily. 90 tablet 3   prochlorperazine (COMPAZINE) 5 MG tablet Take 5 mg by mouth every 6 (six) hours as needed for nausea or vomiting.     tacrolimus (PROTOPIC) 0.1 % ointment Apply  topically 2 (two) times daily. 100 g 0   venlafaxine XR (EFFEXOR-XR) 37.5 MG 24 hr capsule Take 1 capsule (37.5 mg total) by mouth daily with breakfast. Take one capsule by mouth every day 90 capsule 3   No facility-administered medications prior to visit.    No Known Allergies  ROS Review of Systems  Constitutional:  Negative for chills and fever.  HENT:  Negative for congestion, postnasal drip,  sinus pain and sore throat.   Eyes:  Negative for pain and discharge.  Respiratory:  Positive for cough, shortness of breath and wheezing.   Cardiovascular:  Negative for chest pain and palpitations.  Gastrointestinal:  Negative for abdominal pain, diarrhea, nausea and vomiting.  Endocrine: Negative for polydipsia and polyuria.  Genitourinary:  Negative for dysuria and hematuria.  Musculoskeletal:  Positive for arthralgias. Negative for neck pain and neck stiffness.  Skin:  Negative for rash.  Neurological:  Negative for dizziness and weakness.  Psychiatric/Behavioral:  Positive for agitation and sleep disturbance. Negative for behavioral problems. The patient is nervous/anxious.       Objective:    Physical Exam Vitals reviewed.  Constitutional:      General: She is not in acute distress.    Appearance: She is not diaphoretic.  HENT:     Head: Normocephalic and atraumatic.     Nose: No congestion.     Mouth/Throat:     Mouth: Mucous membranes are moist.  Eyes:     General: No scleral icterus.    Extraocular Movements: Extraocular movements intact.  Cardiovascular:     Rate and Rhythm: Normal rate and regular rhythm.     Pulses: Normal pulses.     Heart sounds: Normal heart sounds. No murmur heard. Pulmonary:     Breath sounds: Wheezing (Mild, diffuse) present. No rales.  Abdominal:     Palpations: Abdomen is soft.     Tenderness: There is no abdominal tenderness.  Musculoskeletal:     Cervical back: Neck supple. No tenderness.     Right lower leg: No edema.     Left lower  leg: No edema.  Skin:    General: Skin is warm.     Findings: No rash.     Comments: Skin tag over neck region  Neurological:     General: No focal deficit present.     Mental Status: She is alert and oriented to person, place, and time.     Sensory: No sensory deficit.     Motor: No weakness.  Psychiatric:        Mood and Affect: Mood is depressed.        Behavior: Behavior normal.     BP (!) 154/90 (BP Location: Right Arm, Cuff Size: Normal)   Pulse 88   Ht _0  (1.499 m)   Wt 162 lb 3.2 oz (73.6 kg)   SpO2 93%   BMI 32.76 kg/m  Wt Readings from Last 3 Encounters:  01/13/22 162 lb 3.2 oz (73.6 kg)  10/07/21 158 lb 9.6 oz (71.9 kg)  09/12/21 157 lb 9.6 oz (71.5 kg)    No results found for: "TSH" Lab Results  Component Value Date   WBC 6.9 02/08/2021   HGB 14.0 02/08/2021   HCT 42.6 02/08/2021   MCV 86 02/08/2021   PLT 295 02/08/2021   Lab Results  Component Value Date   NA 140 02/08/2021   K 4.3 02/08/2021   CO2 22 02/08/2021   GLUCOSE 108 (H) 02/08/2021   BUN 16 02/08/2021   CREATININE 0.90 02/08/2021   BILITOT 0.2 02/08/2021   ALKPHOS 50 02/08/2021   AST 27 02/08/2021   ALT 36 (H) 02/08/2021   PROT 6.7 02/08/2021   ALBUMIN 4.5 02/08/2021   CALCIUM 9.3 02/08/2021   EGFR 68 02/08/2021   Lab Results  Component Value Date   CHOL 235 (H) 02/08/2021   Lab Results  Component Value Date   HDL 60 02/08/2021  Lab Results  Component Value Date   LDLCALC 145 (H) 02/08/2021   Lab Results  Component Value Date   TRIG 171 (H) 02/08/2021   Lab Results  Component Value Date   CHOLHDL 3.9 02/08/2021   Lab Results  Component Value Date   HGBA1C 6.3 10/08/2020   HGBA1C 6.3 10/08/2020      Assessment & Plan:   Problem List Items Addressed This Visit       Cardiovascular and Mediastinum   Benign essential hypertension (Chronic)    BP Readings from Last 1 Encounters:  01/13/22 (!) 154/90  Usually well-controlled with amlodipine and  HCTZ Elevated today, likely because she just had her medications today and also has acute bronchitis Counseled for compliance with the medications Advised DASH diet and moderate exercise/walking, at least 150 mins/week      Relevant Orders   CBC with Differential/Platelet   CMP14+EGFR     Respiratory   Chronic obstructive pulmonary disease (COPD) ([facility]) (Chronic)    Currently has acute bronchitis Overall has been well controlled with Breztri and as needed albuterol      Relevant Medications   methylPREDNISolone (MEDROL DOSEPAK) 4 MG TBPK tablet   Acute bronchitis - Primary    Solu-Medrol 40 mg IM today Started Medrol dosepak Continue Mucinex or Robitussin PRN for cough Tessalon as needed for cough Advised to contact if persistent or worsening symptoms      Relevant Medications   methylPREDNISolone (MEDROL DOSEPAK) 4 MG TBPK tablet   Other Relevant Orders   CBC with Differential/Platelet   CMP14+EGFR     Other   GAD (generalized anxiety disorder) (Chronic)    Uncontrolled with Effexor and Xanax Increased dose of Effexor to 75 mg daily PDMP reviewed, refilled Xanax Advised to avoid any illicit drug including marijuana      Relevant Medications   venlafaxine XR (EFFEXOR-XR) 75 MG 24 hr capsule   Other Relevant Orders   CBC with Differential/Platelet   Prediabetes   Relevant Orders   CMP14+EGFR   Hemoglobin A1c   Mixed hyperlipidemia    Lipid profile reviewed On Atorvastatin 10 mg QD      Relevant Orders   Lipid Profile   Mild episode of recurrent major depressive disorder (HCC)    Uncontrolled Increased dose of Effexor to 75 mg daily      Relevant Medications   venlafaxine XR (EFFEXOR-XR) 75 MG 24 hr capsule    Meds ordered this encounter  Medications   methylPREDNISolone (MEDROL DOSEPAK) 4 MG TBPK tablet    Sig: Take as package instructions.    Dispense:  1 each    Refill:  0   venlafaxine XR (EFFEXOR-XR) 75 MG 24 hr capsule    Sig: Take 1 capsule  (75 mg total) by mouth daily with breakfast. Take one capsule by mouth every day    Dispense:  90 capsule    Refill:  0    Dose change   methylPREDNISolone acetate (DEPO-MEDROL) injection 40 mg    Follow-up: Return in about 6 weeks (around 02/24/2022).    Lindell Spar, MD

## 2022-01-13 NOTE — Assessment & Plan Note (Addendum)
Currently has acute bronchitis Overall has been well controlled with Breztri and as needed albuterol

## 2022-01-13 NOTE — Assessment & Plan Note (Signed)
BP Readings from Last 1 Encounters:  01/13/22 (!) 154/90   Usually well-controlled with amlodipine and HCTZ Elevated today, likely because she just had her medications today and also has acute bronchitis Counseled for compliance with the medications Advised DASH diet and moderate exercise/walking, at least 150 mins/week

## 2022-01-13 NOTE — Assessment & Plan Note (Signed)
Uncontrolled Increased dose of Effexor to 75 mg daily

## 2022-01-13 NOTE — Patient Instructions (Signed)
Please take Prednisone as prescribed.  Please use Breztri regularly. Use Albuterol inhaler as needed for shortness of breath or wheezing.  Please start taking Effexor 75 mg instead of 37.5 mg.  Please continue taking medications as prescribed.  Please continue to follow low carb diet and ambulate as tolerated.  Please consider getting Shingrix and Tdap vaccine at your local pharmacy.

## 2022-01-13 NOTE — Assessment & Plan Note (Signed)
Uncontrolled with Effexor and Xanax Increased dose of Effexor to 75 mg daily PDMP reviewed, refilled Xanax Advised to avoid any illicit drug including marijuana

## 2022-01-13 NOTE — Assessment & Plan Note (Signed)
Lipid profile reviewed On Atorvastatin 10 mg QD

## 2022-01-14 ENCOUNTER — Ambulatory Visit (HOSPITAL_COMMUNITY)
Admission: RE | Admit: 2022-01-14 | Discharge: 2022-01-14 | Disposition: A | Discharge status: Home / Self Care | Payer: Medicare PPO | Source: Ambulatory Visit | Attending: Orthopedic Surgery | Admitting: Orthopedic Surgery

## 2022-01-14 ENCOUNTER — Other Ambulatory Visit: Payer: Self-pay | Admitting: Internal Medicine

## 2022-01-14 DIAGNOSIS — G8929 Other chronic pain: Secondary | ICD-10-CM | POA: Insufficient documentation

## 2022-01-14 DIAGNOSIS — M25511 Pain in right shoulder: Secondary | ICD-10-CM | POA: Insufficient documentation

## 2022-01-14 DIAGNOSIS — E782 Mixed hyperlipidemia: Secondary | ICD-10-CM

## 2022-01-14 LAB — CMP14+EGFR
ALT: 32 IU/L (ref 0–32)
AST: 31 IU/L (ref 0–40)
Albumin/Globulin Ratio: 2 (ref 1.2–2.2)
Albumin: 4.9 g/dL — ABNORMAL HIGH (ref 3.8–4.8)
Alkaline Phosphatase: 55 IU/L (ref 44–121)
BUN/Creatinine Ratio: 14 (ref 12–28)
BUN: 13 mg/dL (ref 8–27)
Bilirubin Total: 0.5 mg/dL (ref 0.0–1.2)
CO2: 22 mmol/L (ref 20–29)
Calcium: 9.6 mg/dL (ref 8.7–10.3)
Chloride: 102 mmol/L (ref 96–106)
Creatinine, Ser: 0.91 mg/dL (ref 0.57–1.00)
Globulin, Total: 2.4 g/dL (ref 1.5–4.5)
Glucose: 88 mg/dL (ref 70–99)
Potassium: 4.4 mmol/L (ref 3.5–5.2)
Sodium: 143 mmol/L (ref 134–144)
Total Protein: 7.3 g/dL (ref 6.0–8.5)
eGFR: 66 mL/min/{1.73_m2} (ref >59)

## 2022-01-14 LAB — HEMOGLOBIN A1C
Est. average glucose Bld gHb Est-mCnc: 151 mg/dL
Hgb A1c MFr Bld: 6.9 % — ABNORMAL HIGH (ref 4.8–5.6)

## 2022-01-14 LAB — CBC WITH DIFFERENTIAL/PLATELET
Basophils Absolute: 0 10*3/uL (ref 0.0–0.2)
Basos: 0 %
EOS (ABSOLUTE): 0.2 10*3/uL (ref 0.0–0.4)
Eos: 2 %
Hematocrit: 41.7 % (ref 34.0–46.6)
Hemoglobin: 14 g/dL (ref 11.1–15.9)
Immature Grans (Abs): 0 10*3/uL (ref 0.0–0.1)
Immature Granulocytes: 0 %
Lymphocytes Absolute: 2.3 10*3/uL (ref 0.7–3.1)
Lymphs: 18 %
MCH: 28.9 pg (ref 26.6–33.0)
MCHC: 33.6 g/dL (ref 31.5–35.7)
MCV: 86 fL (ref 79–97)
Monocytes Absolute: 0.9 10*3/uL (ref 0.1–0.9)
Monocytes: 7 %
Neutrophils Absolute: 9.4 10*3/uL — ABNORMAL HIGH (ref 1.4–7.0)
Neutrophils: 73 %
Platelets: 277 10*3/uL (ref 150–450)
RBC: 4.84 x10E6/uL (ref 3.77–5.28)
RDW: 14.3 % (ref 11.7–15.4)
WBC: 13 10*3/uL — ABNORMAL HIGH (ref 3.4–10.8)

## 2022-01-14 LAB — LIPID PANEL
Chol/HDL Ratio: 3.3 ratio (ref 0.0–4.4)
Cholesterol, Total: 231 mg/dL — ABNORMAL HIGH (ref 100–199)
HDL: 69 mg/dL (ref >39)
LDL Chol Calc (NIH): 135 mg/dL — ABNORMAL HIGH (ref 0–99)
Triglycerides: 154 mg/dL — ABNORMAL HIGH (ref 0–149)
VLDL Cholesterol Cal: 27 mg/dL (ref 5–40)

## 2022-01-14 MED ORDER — ATORVASTATIN CALCIUM 20 MG PO TABS: 20.0000 mg | ORAL_TABLET | Freq: Every day | ORAL | 1 refills | Status: DC

## 2022-01-23 ENCOUNTER — Ambulatory Visit (INDEPENDENT_AMBULATORY_CARE_PROVIDER_SITE_OTHER): Payer: Medicare PPO | Admitting: Orthopedic Surgery

## 2022-01-23 DIAGNOSIS — M7581 Other shoulder lesions, right shoulder: Secondary | ICD-10-CM

## 2022-01-23 DIAGNOSIS — M25511 Pain in right shoulder: Secondary | ICD-10-CM

## 2022-01-23 DIAGNOSIS — G8929 Other chronic pain: Secondary | ICD-10-CM | POA: Diagnosis not present

## 2022-01-23 DIAGNOSIS — M7521 Bicipital tendinitis, right shoulder: Secondary | ICD-10-CM

## 2022-01-23 NOTE — Progress Notes (Signed)
Follow-up visit  Patient with anterior shoulder pain no history of trauma.  She has had a cortisone injection in the subacromial space she had an exercise program she had injection in the biceps area.  She takes Tylenol because of a history of bleeding ulcer.  She does not have any arthritis on x-ray.  Her MRI was done to evaluate the rotator cuff  CHIEF COMPLAINT RIGHT SHOULDER PAIN    Encounter Diagnoses  Name Primary?   Biceps tendinitis of right upper extremity Yes   Tendinitis of right rotator cuff    Chronic right shoulder pain     Virtual Visit via Telephone Note  I connected with Robin Bridges on 01/23/22 at 10:00 AM EST by telephone and verified that I am speaking with the correct person using two identifiers.  Location: Patient: HOME  Provider: OFFICE    I discussed the limitations, risks, security and privacy concerns of performing an evaluation and management service by telephone and the availability of in person appointments. I also discussed with the patient that there may be a patient responsible charge related to this service. The patient expressed understanding and agreed to proceed.   History of Present Illness:    Observations/Objective:  12-23-21 76 year old female for follow-up she has a complaint of anterior shoulder pain with no history of trauma.  She saw my associate Dr. Karlton Lemon a few months ago saw me in September she had a cortisone injection subacromial space home exercise program she had an anterior biceps tendon injection she has been on Tylenol because of the bleeding ulcer   She has not improved her x-ray does not show any arthritis   She has a history of carpal tunnel release she is having some tingling in her fingers which is worse at night   She can move her arm out to the side well but has trouble with forward elevation  11-25-21  Dr. Dallas Schimke aw her few months ago and she received a cortisone injection in the subacromial space, she was placed  on home exercise program and she returns for follow-up visit   She reports no improvement in her current pain.  The pain is in the front of the arm and shoulder radiates down to the elbow.  Musculoskeletal: Tenderness over the right bicipital groove and biceps tendon.  Supraspinatus strength is normal abduction strength is normal although there is some pain but it all seems to be in the front of the shoulder   Imaging the x-ray that was taken does not show any osteoarthritis   Assessment and Plan:  MRI REPORT: THIS REPORT WAS READ AS NO TEAR BUT IN MY OPINION series 6 image 12 looks like the tendon is torn  I related findings to the patient.  She says the shoulder is still hurting but she is currently having a bout of bronchitis  Therefore I recommended that we do an arthroscopic evaluation and possible tendon repair she is amenable to this and will see me on 21 December for preop  Follow Up Instructions:    I discussed the assessment and treatment plan with the patient. The patient was provided an opportunity to ask questions and all were answered. The patient agreed with the plan and demonstrated an understanding of the instructions.   The patient was advised to call back or seek an in-person evaluation if the symptoms worsen or if the condition fails to improve as anticipated.  I provided 5 minutes of non-face-to-face time during this encounter.  However,  the evaluation will not be based on time but on the diagnosis reading of the image and plan for surgery Fuller Canada, MD

## 2022-02-26 ENCOUNTER — Ambulatory Visit: Payer: Medicare PPO | Admitting: Internal Medicine

## 2022-02-26 ENCOUNTER — Encounter: Payer: Self-pay | Admitting: Internal Medicine

## 2022-02-26 VITALS — BP 137/85 | HR 78 | Ht 59.0 in | Wt 158.6 lb

## 2022-02-26 DIAGNOSIS — E119 Type 2 diabetes mellitus without complications: Secondary | ICD-10-CM

## 2022-02-26 DIAGNOSIS — E782 Mixed hyperlipidemia: Secondary | ICD-10-CM

## 2022-02-26 DIAGNOSIS — E1169 Type 2 diabetes mellitus with other specified complication: Secondary | ICD-10-CM | POA: Diagnosis not present

## 2022-02-26 DIAGNOSIS — F411 Generalized anxiety disorder: Secondary | ICD-10-CM | POA: Diagnosis not present

## 2022-02-26 DIAGNOSIS — L281 Prurigo nodularis: Secondary | ICD-10-CM

## 2022-02-26 DIAGNOSIS — I10 Essential (primary) hypertension: Secondary | ICD-10-CM

## 2022-02-26 DIAGNOSIS — S8011XA Contusion of right lower leg, initial encounter: Secondary | ICD-10-CM | POA: Diagnosis not present

## 2022-02-26 HISTORY — DX: Type 2 diabetes mellitus without complications: E11.9

## 2022-02-26 MED ORDER — METFORMIN HCL ER 500 MG PO TB24: 500.0000 mg | ORAL_TABLET | Freq: Every day | ORAL | 5 refills | Status: DC

## 2022-02-26 MED ORDER — TRIAMCINOLONE ACETONIDE 0.1 % EX CREA: 1.0000 | TOPICAL_CREAM | Freq: Two times a day (BID) | CUTANEOUS | 0 refills | Status: AC

## 2022-02-26 NOTE — Assessment & Plan Note (Signed)
Lab Results  Component Value Date   HGBA1C 6.9 (H) 01/13/2022   Associated with HTN and HLD New onset Started Metformin Advised to follow diabetic diet On statin F/u CMP and lipid panel Diabetic foot exam: Today Diabetic eye exam: Advised to follow up with Ophthalmology for diabetic eye exam

## 2022-02-26 NOTE — Assessment & Plan Note (Signed)
BP Readings from Last 1 Encounters:  02/26/22 137/85   Usually well-controlled with amlodipine and HCTZ Counseled for compliance with the medications Advised DASH diet and moderate exercise/walking, at least 150 mins/week

## 2022-02-26 NOTE — Assessment & Plan Note (Signed)
Usually well-controlled with Effexor and Xanax Currently has an anhedonia due to grief reaction from loss of her cousin PDMP reviewed, refilled Xanax Advised to avoid any illicit drug including marijuana

## 2022-02-26 NOTE — Progress Notes (Signed)
Established Patient Office Visit  Subjective:  Patient ID: Robin Bridges, female    DOB: 1945/11/10  Age: 76 y.o. MRN: [id number]  CC:  Chief Complaint  Patient presents with   Follow-up    Patient is depressed, she had a family member pass away recently. Patient stated she fell Sunday 02/23/2022, has a knot on her leg    HPI Robin Bridges is a 76 y.o. female with past medical history of HTN, HLD, COPD, prediabetes, GERD, anxiety and tobacco abuse who presents for f/u of her chronic medical conditions.  HTN: BP is well-controlled. Takes medications regularly. Patient denies headache, dizziness, chest pain, dyspnea or palpitations.  She had a fall at her church on 02/24/22 due to slipping on wet floor.  Denies any prodromal symptoms such as headache, dizziness, dyspnea or palpitations.  She has a bump on her right leg, and has mild, dull ache in the area.  Denies any active bleeding.  Denies any head injury or LOC.  She has been feeling depressed due to loss of her cousin about 2 weeks ago.  She denies any hallucinations.  She currently takes Effexor and as needed Xanax for GAD.  Denies any SI or HI currently.  She reports not noticing the dark brownish rash on the left ankle, which she saw in the last week.  Denies any itching.  Denies any recent insect bite.  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   Neuromuscular disorder ([place])    Osteoporosis    Stroke ([place])    Ulcer     Past Surgical History:  Procedure Laterality Date   ABDOMINAL HYSTERECTOMY     COLONOSCOPY N/A 02/19/2017   Procedure: COLONOSCOPY;  Surgeon: Rogene Houston, MD;  Location: [facility];  Service:  Endoscopy;  Laterality: N/A;  830   EYE SURGERY     FRACTURE SURGERY     TUBAL LIGATION      Family History  Problem Relation Age of Onset   Breast cancer Maternal Aunt     Social History   Socioeconomic History   Marital status: Single    Spouse name: Not on file   Number of children: Not on file   Years of education: Not on file   Highest education level: Not on file  Occupational History   Not on file  Tobacco Use   Smoking status: Former    Types: Cigars   Smokeless tobacco: Never  Substance and Sexual Activity   Alcohol use: Yes    Alcohol/week: 1.0 standard drink of alcohol    Types: 1 Cans of beer per week    Comment: some days   Drug use: Yes    Types: Marijuana    Comment: here and there for pain   Sexual activity: Not on file  Other Topics Concern   Not on file  Social History Narrative   Not on file   Social Determinants of Health   Financial Resource Strain: Low Risk  (10/04/2021)   Overall Financial Resource Strain (CARDIA)    Difficulty of Paying Living Expenses: Not hard at all  Food Insecurity: No Food Insecurity (10/04/2021)   Hunger  Vital Sign    Worried About Charity fundraiser in the Last Year: Never true    Ran Out of Food in the Last Year: Never true  Transportation Needs: No Transportation Needs (10/04/2021)   PRAPARE - Hydrologist (Medical): No    Lack of Transportation (Non-Medical): No  Physical Activity: Insufficiently Active (10/04/2021)   Exercise Vital Sign    Days of Exercise per Week: 2 days    Minutes of Exercise per Session: 30 min  Stress: No Stress Concern Present (10/04/2021)   [place]    Feeling of Stress : Not at all  Social Connections: Moderately Isolated (10/04/2021)   Social Connection and Isolation Panel [NHANES]    Frequency of Communication with Friends and Family: More than three times a week    Frequency of Social  Gatherings with Friends and Family: Three times a week    Attends Religious Services: More than 4 times per year    Active Member of Clubs or Organizations: No    Attends Archivist Meetings: Never    Marital Status: Widowed  Intimate Partner Violence: Not At Risk (10/04/2021)   Humiliation, Afraid, Rape, and Kick questionnaire    Fear of Current or Ex-Partner: No    Emotionally Abused: No    Physically Abused: No    Sexually Abused: No    Outpatient Medications Prior to Visit  Medication Sig Dispense Refill   albuterol (VENTOLIN HFA) 108 (90 Base) MCG/ACT inhaler Inhale 2 puffs into the lungs every 6 (six) hours as needed for wheezing or shortness of breath. 8 g 4   ALPRAZolam (XANAX) 0.5 MG tablet TAKE 1 TABLET BY MOUTH TWICE DAILY AS NEEDED FOR ANXIETY 60 tablet 4   amLODipine (NORVASC) 10 MG tablet Take 1 tablet (10 mg total) by mouth daily. 90 tablet 3   aspirin EC 81 MG tablet Take 81 mg by mouth daily.     atorvastatin (LIPITOR) 20 MG tablet Take 1 tablet (20 mg total) by mouth daily. 90 tablet 1   benzonatate (TESSALON) 100 MG capsule Take 1 capsule (100 mg total) by mouth 2 (two) times daily as needed for cough. 20 capsule 0   Budeson-Glycopyrrol-Formoterol (BREZTRI AEROSPHERE) 160-9-4.8 MCG/ACT AERO Inhale 2 puffs into the lungs 2 (two) times daily. 10.7 g 11   fluticasone (FLONASE) 50 MCG/ACT nasal spray Place 2 sprays into both nostrils daily. 16 g 6   hydrochlorothiazide (MICROZIDE) 12.5 MG capsule TAKE ONE CAPSULE BY MOUTH EVERY DAY 90 capsule 1   meclizine (ANTIVERT) 12.5 MG tablet Take by mouth.     montelukast (SINGULAIR) 10 MG tablet Take 1 tablet (10 mg total) by mouth at bedtime. 90 tablet 3   naproxen (NAPROSYN) 500 MG tablet Take 1 tablet (500 mg total) by mouth 2 (two) times daily with a meal. 30 tablet 1   pantoprazole (PROTONIX) 40 MG tablet Take 1 tablet (40 mg total) by mouth daily. 90 tablet 3   prochlorperazine (COMPAZINE) 5 MG tablet Take 5 mg by  mouth every 6 (six) hours as needed for nausea or vomiting.     tacrolimus (PROTOPIC) 0.1 % ointment Apply topically 2 (two) times daily. 100 g 0   venlafaxine XR (EFFEXOR-XR) 75 MG 24 hr capsule Take 1 capsule (75 mg total) by mouth daily with breakfast. Take one capsule by mouth every day 90 capsule 0   methylPREDNISolone (MEDROL DOSEPAK) 4 MG TBPK tablet  Take as package instructions. 1 each 0   No facility-administered medications prior to visit.    No Known Allergies  ROS Review of Systems  Constitutional:  Negative for chills and fever.  HENT:  Negative for congestion, postnasal drip, sinus pain and sore throat.   Eyes:  Negative for pain and discharge.  Respiratory:  Negative for cough, shortness of breath and wheezing.   Cardiovascular:  Negative for chest pain and palpitations.  Gastrointestinal:  Negative for abdominal pain, diarrhea, nausea and vomiting.  Endocrine: Negative for polydipsia and polyuria.  Genitourinary:  Negative for dysuria and hematuria.  Musculoskeletal:  Positive for arthralgias. Negative for neck pain and neck stiffness.  Skin:  Positive for rash.  Neurological:  Negative for dizziness and weakness.  Psychiatric/Behavioral:  Positive for agitation, dysphoric mood and sleep disturbance. Negative for behavioral problems. The patient is nervous/anxious.       Objective:    Physical Exam Vitals reviewed.  Constitutional:      General: She is not in acute distress.    Appearance: She is not diaphoretic.  HENT:     Head: Normocephalic and atraumatic.     Nose: No congestion.     Mouth/Throat:     Mouth: Mucous membranes are moist.  Eyes:     General: No scleral icterus.    Extraocular Movements: Extraocular movements intact.  Cardiovascular:     Rate and Rhythm: Normal rate and regular rhythm.     Pulses: Normal pulses.     Heart sounds: Normal heart sounds. No murmur heard. Pulmonary:     Breath sounds: Normal breath sounds. No wheezing or  rales.  Musculoskeletal:     Cervical back: Neck supple. No tenderness.     Right lower leg: No edema.     Left lower leg: No edema.  Skin:    General: Skin is warm.     Findings: Rash (Dark brownish patch lateral malleolus of LLE) present.     Comments: About 2 cm in diameter bump over right leg, beneath right knee -likely hematoma Skin tag over neck region  Neurological:     General: No focal deficit present.     Mental Status: She is alert and oriented to person, place, and time.     Sensory: No sensory deficit.     Motor: No weakness.  Psychiatric:        Mood and Affect: Mood is depressed.        Behavior: Behavior normal.     BP 137/85 (BP Location: Right Arm, Patient Position: Sitting, Cuff Size: Normal)   Pulse 78   Ht _0  (1.499 m)   Wt 158 lb 9.6 oz (71.9 kg)   SpO2 94%   BMI 32.03 kg/m  Wt Readings from Last 3 Encounters:  02/26/22 158 lb 9.6 oz (71.9 kg)  01/13/22 162 lb 3.2 oz (73.6 kg)  10/07/21 158 lb 9.6 oz (71.9 kg)    No results found for: "TSH" Lab Results  Component Value Date   WBC 13.0 (H) 01/13/2022   HGB 14.0 01/13/2022   HCT 41.7 01/13/2022   MCV 86 01/13/2022   PLT 277 01/13/2022   Lab Results  Component Value Date   NA 143 01/13/2022   K 4.4 01/13/2022   CO2 22 01/13/2022   GLUCOSE 88 01/13/2022   BUN 13 01/13/2022   CREATININE 0.91 01/13/2022   BILITOT 0.5 01/13/2022   ALKPHOS 55 01/13/2022   AST 31 01/13/2022   ALT 32 01/13/2022  PROT 7.3 01/13/2022   ALBUMIN 4.9 (H) 01/13/2022   CALCIUM 9.6 01/13/2022   EGFR 66 01/13/2022   Lab Results  Component Value Date   CHOL 231 (H) 01/13/2022   Lab Results  Component Value Date   HDL 69 01/13/2022   Lab Results  Component Value Date   LDLCALC 135 (H) 01/13/2022   Lab Results  Component Value Date   TRIG 154 (H) 01/13/2022   Lab Results  Component Value Date   CHOLHDL 3.3 01/13/2022   Lab Results  Component Value Date   HGBA1C 6.9 (H) 01/13/2022       Assessment & Plan:   Problem List Items Addressed This Visit       Cardiovascular and Mediastinum   Benign essential hypertension - Primary (Chronic)    BP Readings from Last 1 Encounters:  02/26/22 137/85  Usually well-controlled with amlodipine and HCTZ Counseled for compliance with the medications Advised DASH diet and moderate exercise/walking, at least 150 mins/week        Endocrine   Diabetes mellitus ([place])    Lab Results  Component Value Date   HGBA1C 6.9 (H) 01/13/2022   Associated with HTN and HLD New onset Started Metformin Advised to follow diabetic diet On statin F/u CMP and lipid panel Diabetic foot exam: Today Diabetic eye exam: Advised to follow up with Ophthalmology for diabetic eye exam      Relevant Medications   metFORMIN (GLUCOPHAGE-XR) 500 MG 24 hr tablet   Other Relevant Orders   CMP14+EGFR   Hemoglobin A1c   Urine Microalbumin w/creat. ratio     Musculoskeletal and Integument   Prurigo nodularis    Left ankle area rash likely prurigo nodularis Kenalog cream prescribed      Relevant Medications   triamcinolone cream (KENALOG) 0.1 %     Other   GAD (generalized anxiety disorder) (Chronic)    Usually well-controlled with Effexor and Xanax Currently has an anhedonia due to grief reaction from loss of her cousin PDMP reviewed, refilled Xanax Advised to avoid any illicit drug including marijuana      Mixed hyperlipidemia   Relevant Orders   Lipid Profile   Hematoma of leg, right, initial encounter    Had mechanical fall, likely has hematoma of right leg ROM at right knee intact Advised to apply ice Perform leg elevation       Meds ordered this encounter  Medications   triamcinolone cream (KENALOG) 0.1 %    Sig: Apply 1 Application topically 2 (two) times daily.    Dispense:  30 g    Refill:  0   metFORMIN (GLUCOPHAGE-XR) 500 MG 24 hr tablet    Sig: Take 1 tablet (500 mg total) by mouth daily with breakfast.    Dispense:  30  tablet    Refill:  5    Follow-up: Return in about 4 months (around 06/28/2022) for Annual physical.    Lindell Spar, MD

## 2022-02-26 NOTE — Patient Instructions (Addendum)
Please start taking Metformin as prescribed.  Please apply ice over leg hematoma. Please perform leg elevation as tolerated.  Please apply Kenalog cream over left ankle area rash as prescribed.  Please continue to take medications as prescribed.  Please continue to follow low salt diet and ambulate as tolerated.

## 2022-02-26 NOTE — Assessment & Plan Note (Signed)
Left ankle area rash likely prurigo nodularis Kenalog cream prescribed

## 2022-02-26 NOTE — Assessment & Plan Note (Signed)
Had mechanical fall, likely has hematoma of right leg ROM at right knee intact Advised to apply ice Perform leg elevation

## 2022-02-27 ENCOUNTER — Ambulatory Visit: Payer: Medicare PPO | Admitting: Orthopedic Surgery

## 2022-02-27 ENCOUNTER — Encounter: Payer: Self-pay | Admitting: Orthopedic Surgery

## 2022-02-27 VITALS — BP 134/81 | HR 69 | Ht 59.0 in | Wt 159.0 lb

## 2022-02-27 DIAGNOSIS — M75111 Incomplete rotator cuff tear or rupture of right shoulder, not specified as traumatic: Secondary | ICD-10-CM | POA: Diagnosis not present

## 2022-02-27 DIAGNOSIS — M7521 Bicipital tendinitis, right shoulder: Secondary | ICD-10-CM | POA: Diagnosis not present

## 2022-02-27 DIAGNOSIS — M7581 Other shoulder lesions, right shoulder: Secondary | ICD-10-CM | POA: Diagnosis not present

## 2022-02-27 DIAGNOSIS — Z01818 Encounter for other preprocedural examination: Secondary | ICD-10-CM

## 2022-02-27 DIAGNOSIS — G8929 Other chronic pain: Secondary | ICD-10-CM

## 2022-02-27 NOTE — Patient Instructions (Addendum)
Your surgery will be at [facility] by Dr Romeo Apple  The hospital will contact you with a preoperative appointment to discuss Anesthesia.  Please arrive on time or 15 minutes early for the preoperative appointment, they have a very tight schedule if you are late or do not come in your surgery will be cancelled.  The phone number is [phone]. Please bring your medications with you for the appointment. They will tell you the arrival time and medication instructions when you have your preoperative evaluation. Do not wear nail polish the day of your surgery and if you take Phentermine you need to stop this medication ONE WEEK prior to your surgery. If you take Docia Barrier, Jardiance, or Steglatro) - Hold 72 hours before the procedure.  If you take Ozempic,  Bydureon or Trulicity do not take for 8 days before your surgery. If you take Victoza, Rybelsis, Saxenda or Adlyxi stop 24 hours before the procedure.  Please arrive at the hospital 2 hours before procedure if scheduled at 9:30 or later in the day or at the time the nurse tells you at your preoperative visit.   If you have my chart do not use the time given in my chart use the time given to you by the nurse during your preoperative visit.   Your surgery  time may change. Please be available for phone calls the day of your surgery and the day before. The Short Stay department may need to discuss changes about your surgery time. Not reaching the you could lead to procedure delays and possible cancellation.  You must have a ride home and someone to stay with you for 24 to 48 hours. The person taking you home will receive and sign for the your discharge instructions.  Please be prepared to give your support person's name and telephone number to Central Registration. Dr Romeo Apple will need that name and phone number post procedure.   Surgery for Rotator Cuff Tear  Rotator cuff surgery is only recommended for individuals who have experienced  persistent disability for greater than 3 months of non-surgical (conservative) treatment. Surgery is not necessary but is recommended for individuals who experience difficulty completing daily activities or athletes who are unable to compete. Rotator cuff tears do not usually heal without surgical intervention. If left alone small rotator cuff tears usually become larger. Younger athletes who have a rotator cuff tear may be recommended for surgery without attempting conservative rehabilitation. The purpose of surgery is to regain function of the shoulder joint and eliminate pain associated with the injury. In addition to repairing the tendon tear, the surgery may often remove a portion of the bony roof of the shoulder (acromion) as well as the chronically thickened and inflamed membrane below the acromion (subacromial bursa). REASONS NOT TO OPERATE  Infection of the shoulder. Inability to complete a rehabilitation program. Patients who have other conditions (emotional or psychological) conditions that contribute to their shoulder condition. RISKS AND COMPLICATIONS Infection. Re-tear of the rotator cuff tendons or muscles. Shoulder stiffness and/or weakness. Inability to compete in athletics. [facility]) joint pain Risks of surgery: infection, bleeding, nerve damage, or damage to surrounding tissues. TECHNIQUE There are different surgical procedures used to treat rotator cuff tears. The type of procedure depends on the extent of injury as well as the surgeon's preference. All of the surgical techniques for rotator cuff tears have the same goal of repairing the torn tendon, removing part of the acromion, and removing the subacromial bursa. There are  two main types of procedures: arthroscopic and open incision. Arthroscopic procedures are usually completed and you go home the same day as surgery (outpatient). These procedures use multiple small incisions in which tools and a video camera are  placed to work on the shoulder. An electric shaver removes the bursa, then a power burr shaves down the portion of the acromion that places pressure on the rotator cuff. Finally the rotator cuff is sewed (sutured) back to the humeral head. Open incision procedures require a larger incision. The deltoid muscle is detached from the acromion and a ligament in the shoulder (coracoacromial) is cut in order for the surgeon to access the rotator cuff. The subacromial bursa is removed as well as part of the acromion to give the rotator cuff room to move freely. The torn tendon is then sutured to the humeral head. After the rotator cuff is repaired, then the deltoid is reattached and the incision is closed up.  RECOVERY  Post-operative care depends on the surgical technique and the preferences of your therapist. Keep the wound clean and dry for the first 10 to 14 days after surgery. Keep your shoulder and arm in the sling provided to you for as long as you have been instructed to. You will be given pain medications by your caregiver. Passive (without using muscles) shoulder movements may be begun when instructed. It is important to follow through with you rehabilitation program in order to have the best possible recovery. RETURN TO SPORTS  The rehabilitation period will depend on the sport and position you play as well as the success of the operation. The minimum recovery period is 6 months. You must have regained complete shoulder motion and strength before returning to sports. SEEK IMMEDIATE MEDICAL CARE IF:  Any medications produce adverse side effects. Any complications from surgery occur: Pain, numbness, or coldness in the extremity operated upon. Discoloration of the nail beds (they become blue or gray) of the extremity operated upon. Signs of infections (fever, pain, inflammation, redness, or persistent bleeding).   You have decided to proceed with rotator cuff repair surgery. You have decided not  to continue with nonoperative measures such as but not limited to oral medication,   activity modification, physical therapy, bracing, or injection.  We will perform rotator cuff repair. Some of the risks associated with rotator cuff repair include but are not limited to Bleeding Infection Swelling Stiffness Blood clot Pain Re-tearing of the rotator cuff Failure of the rotator cuff to heal   If you're not comfortable with these risks and would like to continue with nonoperative treatment please let Dr. Romeo Apple know prior to your surgery.

## 2022-02-27 NOTE — Progress Notes (Signed)
Chief Complaint  Patient presents with   Shoulder Pain    Rt shoulder    Robin Bridges is 76 years old she is asked for an appointment to be reevaluated for possible surgery Right shoulder  MRI findings listed below.  My interpretation was that the left did have a tear supraspinatus which is differing from the radiology report.  Reexamination shows that she cannot quite get the arm to 90 degrees is really about 60 degrees and abduction flexion is 100 degrees she is weak in flexion not so much in abduction  She is tender around the anterior shoulder and anterolateral acromion and deltoid  She is saying that she cannot tolerate the shoulder the way it is anymore and would like to proceed with surgery  We discussed the following  Same-day surgery  She has help at home  Sling 4 weeks after surgery  Physical therapy  Arthroscopic evaluation will determine if there is a tear and how big and how much of repair is needed  Assessment and plan  Encounter Diagnoses  Name Primary?   Tendinitis of right rotator cuff    Biceps tendinitis of right upper extremity    Chronic right shoulder pain    Nontraumatic incomplete tear of right rotator cuff Yes    Plan arthroscopy right shoulder mini open rotator cuff repair FINDINGS: Rotator cuff: Severe tendinosis of the supraspinatus tendon. Mild tendinosis of the infraspinatus tendon. Teres minor tendon is intact. Subscapularis tendon is intact.   Muscles: No muscle atrophy or edema. No intramuscular fluid collection or hematoma.   Biceps Long Head: Intraarticular and extraarticular portions of the biceps tendon are intact.   Acromioclavicular Joint: Moderate arthropathy of the acromioclavicular joint. No subacromial/subdeltoid bursal fluid.   Glenohumeral Joint: No joint effusion. No chondral defect.   Labrum: Grossly intact, but evaluation is limited by lack of intraarticular fluid/contrast.   Bones: No fracture or dislocation. No  aggressive osseous lesion. Intraosseous cyst at the base of the coracoid process.   Other: No fluid collection or hematoma.   IMPRESSION: 1. Severe tendinosis of the supraspinatus tendon. 2. Mild tendinosis of the infraspinatus tendon.     Electronically Signed   By: Elige Ko M.D.   On: 01/17/2022 05:44

## 2022-02-27 NOTE — Addendum Note (Signed)
Addended byCaffie Damme on: 02/27/2022 04:47 PM   Modules accepted: Orders

## 2022-02-28 LAB — MICROALBUMIN / CREATININE URINE RATIO
Creatinine, Urine: 101.9 mg/dL
Microalb/Creat Ratio: 6 mg/g creat (ref 0–29)
Microalbumin, Urine: 5.7 ug/mL

## 2022-03-19 NOTE — Patient Instructions (Signed)
Robin Bridges  03/19/2022     @PREFPERIOPPHARMACY @   Your procedure is scheduled on  03/25/2022.   Report to [facility] at  1030  A.M.   Call this number if you have problems the morning of surgery:  [phone]  If you experience any cold or flu symptoms such as cough, fever, chills, shortness of breath, etc. between now and your scheduled surgery, please notify [phone] at the above number.   Remember:  Do not eat or drink after midnight.         DO NOT take any medications for diabetes the morning of your procedure.      Use your inhalers before you come and bring your rescue inhaler with you.     Take these medicines the morning of surgery with A SIP OF WATER          xanax(if needed), amlodipine, antivert(if needed), pantoprazole, effexor.     Do not wear jewelry, make-up or nail polish.  Do not wear lotions, powders, or perfumes, or deodorant.  Do not shave 48 hours prior to surgery.  Men may shave face and neck.  Do not bring valuables to the hospital.  [facility] is not responsible for any belongings or valuables.  Contacts, dentures or bridgework may not be worn into surgery.  Leave your suitcase in the car.  After surgery it may be brought to your room.  For patients admitted to the hospital, discharge time will be determined by your treatment team.  Patients discharged the day of surgery will not be allowed to drive home and must have someone with them for 24 hours.    Special instructions:   DO NOT smoke tobacco or vape for 24 hours before your procedure.  Please read over the following fact sheets that you were given. Pain Booklet, Coughing and Deep Breathing, Surgical Site Infection Prevention, Anesthesia Post-op Instructions, and Care and Recovery After Surgery      Surgery for Rotator Cuff Tear, Care After This sheet gives you information about how to care for yourself after your procedure. Your health care provider may also give you more  specific instructions. If you have problems or questions, contact your health care provider. What can I expect after the procedure? After the procedure, it is common to have: Redness. Swelling. A small amount of fluid or blood in the incision area. Pain. Stiffness. Follow these instructions at home: If you have a sling or a shoulder immobilizer: Wear it as told by your health care provider. Remove it only as told by your health care provider. Loosen it if your fingers tingle, become numb, or turn cold and blue. Keep it clean and dry. Bathing Do not take baths, swim, or use a hot tub until your health care provider approves. Ask your health care provider if you may take showers. You may only be allowed to take sponge baths. Keep your bandage (dressing) dry until your health care provider says it can be removed. If your sling or shoulder immobilizer is not waterproof: Do not let it get wet. Remove it when you take a bath or shower as told by your health care provider. Once the sling or shoulder immobilizer is removed, try not to move your shoulder until your health care provider says that you can. Incision care  Follow instructions from your health care provider about how to take care of your incision. Make sure you: Wash your hands with soap  and water for at least 20 seconds before and after you change your dressing. If soap and water are not available, use hand sanitizer. Change your dressing as told by your health care provider. Leave stitches (sutures), skin glue, or adhesive strips in place. These skin closures may need to stay in place for 2 weeks or longer. If adhesive strip edges start to loosen and curl up, you may trim the loose edges. Do not remove adhesive strips completely unless your health care provider tells you to do that. Check your incision area every day for signs of infection. Check for: More redness, swelling, or pain. More fluid or blood. Warmth. Pus or a bad  smell. Managing pain, stiffness, and swelling  If directed, put ice on your shoulder area. To do this: Put ice in a plastic bag. Place a towel between your skin and the bag. Leave the ice on for 20 minutes, 2-3 times a day. Remove the ice if your skin turns bright red. This is very important. If you cannot feel pain, heat, or cold, you have a greater risk of damage to the area. Move your fingers often to reduce stiffness and swelling. Raise (elevate) your upper body on pillows when you lie down and when you sleep. Do not sleep on the front of your body (abdomen). Do not sleep on the side that your surgery was performed on. Medicines Take over-the-counter and prescription medicines only as told by your health care provider. Ask your health care provider if the medicine prescribed to you: Requires you to avoid driving or using machinery. Can cause constipation. You may need to take these actions to prevent or treat constipation: Drink enough fluid to keep your urine pale yellow. Take over-the-counter or prescription medicines. Eat foods that are high in fiber, such as beans, whole grains, and fresh fruits and vegetables. Limit foods that are high in fat and processed sugars, such as fried or sweet foods. Driving If you were given a sedative during the procedure, it can affect you for several hours. Do not drive or operate machinery until your health care provider says that it is safe. Do not drive while wearing a sling or a shoulder immobilizer. Ask your health care provider when it is safe to drive. Activity Do not use your arm to support your body weight until your health care provider says that you can. Do not lift or hold anything with your arm until your health care provider approves. Return to your normal activities as told by your health care provider. Ask your health care provider what activities are safe for you. Do exercises as told by your health care provider. General  instructions Do not use any products that contain nicotine or tobacco, such as cigarettes, e-cigarettes, and chewing tobacco. These can delay healing after surgery. If you need help quitting, ask your health care provider. Keep all follow-up visits. This is important. Contact a health care provider if: You have a fever or chills. You have more redness, swelling, or pain around your incision. You have more fluid or blood coming from your incision. Your incision feels warm to the touch. You have pus or a bad smell coming from your incision. You have pain that gets worse or does not get better with medicine. Get help right away if: You have severe pain. You lose feeling in your arm or hand. Your hand or fingers turn very pale or blue. Summary If you have a sling or shoulder immobilizer, wear it as told  by your health care provider. Remove it only as told by your health care provider. Change your dressing as told by your health care provider. Check the incision area every day for signs of infection. If directed, put ice on your shoulder area 2-3 times a day. Do not use your arm to lift anything or to support your body weight until your health care provider says that you can. This information is not intended to replace advice given to you by your health care provider. Make sure you discuss any questions you have with your health care provider. Document Revised: 06/29/2019 Document Reviewed: 06/29/2019 Elsevier Patient Education  [place] Anesthesia, Adult, Care After The following information offers guidance on how to care for yourself after your procedure. Your health care provider may also give you more specific instructions. If you have problems or questions, contact your health care provider. What can I expect after the procedure? After the procedure, it is common for people to: Have pain or discomfort at the IV site. Have nausea or vomiting. Have a sore throat or  hoarseness. Have trouble concentrating. Feel cold or chills. Feel weak, sleepy, or tired (fatigue). Have soreness and body aches. These can affect parts of the body that were not involved in surgery. Follow these instructions at home: For the time period you were told by your health care provider:  Rest. Do not participate in activities where you could fall or become injured. Do not drive or use machinery. Do not drink alcohol. Do not take sleeping pills or medicines that cause drowsiness. Do not make important decisions or sign legal documents. Do not take care of children on your own. General instructions Drink enough fluid to keep your urine pale yellow. If you have sleep apnea, surgery and certain medicines can increase your risk for breathing problems. Follow instructions from your health care provider about wearing your sleep device: Anytime you are sleeping, including during daytime naps. While taking prescription pain medicines, sleeping medicines, or medicines that make you drowsy. Return to your normal activities as told by your health care provider. Ask your health care provider what activities are safe for you. Take over-the-counter and prescription medicines only as told by your health care provider. Do not use any products that contain nicotine or tobacco. These products include cigarettes, chewing tobacco, and vaping devices, such as e-cigarettes. These can delay incision healing after surgery. If you need help quitting, ask your health care provider. Contact a health care provider if: You have nausea or vomiting that does not get better with medicine. You vomit every time you eat or drink. You have pain that does not get better with medicine. You cannot urinate or have bloody urine. You develop a skin rash. You have a fever. Get help right away if: You have trouble breathing. You have chest pain. You vomit blood. These symptoms may be an emergency. Get help right  away. Call 911. Do not wait to see if the symptoms will go away. Do not drive yourself to the hospital. Summary After the procedure, it is common to have a sore throat, hoarseness, nausea, vomiting, or to feel weak, sleepy, or fatigue. For the time period you were told by your health care provider, do not drive or use machinery. Get help right away if you have difficulty breathing, have chest pain, or vomit blood. These symptoms may be an emergency. This information is not intended to replace advice given to you by your health care provider. Make  sure you discuss any questions you have with your health care provider. Document Revised: 05/24/2021 Document Reviewed: 05/24/2021 Elsevier Patient Education  [place]. How to Use Chlorhexidine Before Surgery Chlorhexidine gluconate (CHG) is a germ-killing (antiseptic) solution that is used to clean the skin. It can get rid of the bacteria that normally live on the skin and can keep them away for about 24 hours. To clean your skin with CHG, you may be given: A CHG solution to use in the shower or as part of a sponge bath. A prepackaged cloth that contains CHG. Cleaning your skin with CHG may help lower the risk for infection: While you are staying in the intensive care unit of the hospital. If you have a vascular access, such as a central line, to provide short-term or long-term access to your veins. If you have a catheter to drain urine from your bladder. If you are on a ventilator. A ventilator is a machine that helps you breathe by moving air in and out of your lungs. After surgery. What are the risks? Risks of using CHG include: A skin reaction. Hearing loss, if CHG gets in your ears and you have a perforated eardrum. Eye injury, if CHG gets in your eyes and is not rinsed out. The CHG product catching fire. Make sure that you avoid smoking and flames after applying CHG to your skin. Do not use CHG: If you have a chlorhexidine  allergy or have previously reacted to chlorhexidine. On babies younger than 31 months of age. How to use CHG solution Use CHG only as told by your health care provider, and follow the instructions on the label. Use the full amount of CHG as directed. Usually, this is one bottle. During a shower Follow these steps when using CHG solution during a shower (unless your health care provider gives you different instructions): Start the shower. Use your normal soap and shampoo to wash your face and hair. Turn off the shower or move out of the shower stream. Pour the CHG onto a clean washcloth. Do not use any type of brush or rough-edged sponge. Starting at your neck, lather your body down to your toes. Make sure you follow these instructions: If you will be having surgery, pay special attention to the part of your body where you will be having surgery. Scrub this area for at least 1 minute. Do not use CHG on your head or face. If the solution gets into your ears or eyes, rinse them well with water. Avoid your genital area. Avoid any areas of skin that have broken skin, cuts, or scrapes. Scrub your back and under your arms. Make sure to wash skin folds. Let the lather sit on your skin for 1-2 minutes or as long as told by your health care provider. Thoroughly rinse your entire body in the shower. Make sure that all body creases and crevices are rinsed well. Dry off with a clean towel. Do not put any substances on your body afterward--such as powder, lotion, or perfume--unless you are told to do so by your health care provider. Only use lotions that are recommended by the manufacturer. Put on clean clothes or pajamas. If it is the night before your surgery, sleep in clean sheets.  During a sponge bath Follow these steps when using CHG solution during a sponge bath (unless your health care provider gives you different instructions): Use your normal soap and shampoo to wash your face and hair. Pour the  CHG onto  a clean washcloth. Starting at your neck, lather your body down to your toes. Make sure you follow these instructions: If you will be having surgery, pay special attention to the part of your body where you will be having surgery. Scrub this area for at least 1 minute. Do not use CHG on your head or face. If the solution gets into your ears or eyes, rinse them well with water. Avoid your genital area. Avoid any areas of skin that have broken skin, cuts, or scrapes. Scrub your back and under your arms. Make sure to wash skin folds. Let the lather sit on your skin for 1-2 minutes or as long as told by your health care provider. Using a different clean, wet washcloth, thoroughly rinse your entire body. Make sure that all body creases and crevices are rinsed well. Dry off with a clean towel. Do not put any substances on your body afterward--such as powder, lotion, or perfume--unless you are told to do so by your health care provider. Only use lotions that are recommended by the manufacturer. Put on clean clothes or pajamas. If it is the night before your surgery, sleep in clean sheets. How to use CHG prepackaged cloths Only use CHG cloths as told by your health care provider, and follow the instructions on the label. Use the CHG cloth on clean, dry skin. Do not use the CHG cloth on your head or face unless your health care provider tells you to. When washing with the CHG cloth: Avoid your genital area. Avoid any areas of skin that have broken skin, cuts, or scrapes. Before surgery Follow these steps when using a CHG cloth to clean before surgery (unless your health care provider gives you different instructions): Using the CHG cloth, vigorously scrub the part of your body where you will be having surgery. Scrub using a back-and-forth motion for 3 minutes. The area on your body should be completely wet with CHG when you are done scrubbing. Do not rinse. Discard the cloth and let the area  air-dry. Do not put any substances on the area afterward, such as powder, lotion, or perfume. Put on clean clothes or pajamas. If it is the night before your surgery, sleep in clean sheets.  For general bathing Follow these steps when using CHG cloths for general bathing (unless your health care provider gives you different instructions). Use a separate CHG cloth for each area of your body. Make sure you wash between any folds of skin and between your fingers and toes. Wash your body in the following order, switching to a new cloth after each step: The front of your neck, shoulders, and chest. Both of your arms, under your arms, and your hands. Your stomach and groin area, avoiding the genitals. Your right leg and foot. Your left leg and foot. The back of your neck, your back, and your buttocks. Do not rinse. Discard the cloth and let the area air-dry. Do not put any substances on your body afterward--such as powder, lotion, or perfume--unless you are told to do so by your health care provider. Only use lotions that are recommended by the manufacturer. Put on clean clothes or pajamas. Contact a health care provider if: Your skin gets irritated after scrubbing. You have questions about using your solution or cloth. You swallow any chlorhexidine. Call your local poison control center (1-908-717-0764 in the [place] Get help right away if: Your eyes itch badly, or they become very red or swollen. Your skin itches badly and  is red or swollen. Your hearing changes. You have trouble seeing. You have swelling or tingling in your mouth or throat. You have trouble breathing. These symptoms may represent a serious problem that is an emergency. Do not wait to see if the symptoms will go away. Get medical help right away. Call your local emergency services (911 in the [place] Do not drive yourself to the hospital. Summary Chlorhexidine gluconate (CHG) is a germ-killing (antiseptic) solution that is used  to clean the skin. Cleaning your skin with CHG may help to lower your risk for infection. You may be given CHG to use for bathing. It may be in a bottle or in a prepackaged cloth to use on your skin. Carefully follow your health care provider's instructions and the instructions on the product label. Do not use CHG if you have a chlorhexidine allergy. Contact your health care provider if your skin gets irritated after scrubbing. This information is not intended to replace advice given to you by your health care provider. Make sure you discuss any questions you have with your health care provider. Document Revised: 06/24/2021 Document Reviewed: 05/07/2020 Elsevier Patient Education  [place].

## 2022-03-21 ENCOUNTER — Encounter (HOSPITAL_COMMUNITY): Payer: Self-pay

## 2022-03-21 ENCOUNTER — Encounter (HOSPITAL_COMMUNITY)
Admission: RE | Admit: 2022-03-21 | Discharge: 2022-03-21 | Disposition: A | Discharge status: Home / Self Care | Payer: Medicare PPO | Source: Ambulatory Visit | Attending: Orthopedic Surgery | Admitting: Orthopedic Surgery

## 2022-03-21 VITALS — BP 128/85 | HR 103 | Temp 98.5°F | Resp 18 | Ht 59.0 in | Wt 159.0 lb

## 2022-03-21 DIAGNOSIS — Z01818 Encounter for other preprocedural examination: Secondary | ICD-10-CM | POA: Insufficient documentation

## 2022-03-21 DIAGNOSIS — I10 Essential (primary) hypertension: Secondary | ICD-10-CM | POA: Insufficient documentation

## 2022-03-21 DIAGNOSIS — M7581 Other shoulder lesions, right shoulder: Secondary | ICD-10-CM | POA: Diagnosis not present

## 2022-03-21 DIAGNOSIS — E119 Type 2 diabetes mellitus without complications: Secondary | ICD-10-CM | POA: Diagnosis not present

## 2022-03-21 DIAGNOSIS — M75111 Incomplete rotator cuff tear or rupture of right shoulder, not specified as traumatic: Secondary | ICD-10-CM | POA: Insufficient documentation

## 2022-03-21 LAB — CBC WITH DIFFERENTIAL/PLATELET
Abs Immature Granulocytes: 0.02 10*3/uL (ref 0.00–0.07)
Basophils Absolute: 0 10*3/uL (ref 0.0–0.1)
Basophils Relative: 0 %
Eosinophils Absolute: 0.2 10*3/uL (ref 0.0–0.5)
Eosinophils Relative: 2 %
HCT: 43.1 % (ref 36.0–46.0)
Hemoglobin: 14.1 g/dL (ref 12.0–15.0)
Immature Granulocytes: 0 %
Lymphocytes Relative: 27 %
Lymphs Abs: 2.4 10*3/uL (ref 0.7–4.0)
MCH: 28.7 pg (ref 26.0–34.0)
MCHC: 32.7 g/dL (ref 30.0–36.0)
MCV: 87.8 fL (ref 80.0–100.0)
Monocytes Absolute: 0.7 10*3/uL (ref 0.1–1.0)
Monocytes Relative: 8 %
Neutro Abs: 5.3 10*3/uL (ref 1.7–7.7)
Neutrophils Relative %: 63 %
Platelets: 304 10*3/uL (ref 150–400)
RBC: 4.91 MIL/uL (ref 3.87–5.11)
RDW: 14.7 % (ref 11.5–15.5)
WBC: 8.6 10*3/uL (ref 4.0–10.5)
nRBC: 0 % (ref 0.0–0.2)

## 2022-03-21 LAB — BASIC METABOLIC PANEL
Anion gap: 12 (ref 5–15)
BUN: 22 mg/dL (ref 8–23)
CO2: 22 mmol/L (ref 22–32)
Calcium: 9.3 mg/dL (ref 8.9–10.3)
Chloride: 102 mmol/L (ref 98–111)
Creatinine, Ser: 0.81 mg/dL (ref 0.44–1.00)
GFR, Estimated: >60 mL/min (ref >60)
Glucose, Bld: 115 mg/dL — ABNORMAL HIGH (ref 70–99)
Potassium: 4 mmol/L (ref 3.5–5.1)
Sodium: 136 mmol/L (ref 135–145)

## 2022-03-22 LAB — HEMOGLOBIN A1C
Hgb A1c MFr Bld: 6.9 % — ABNORMAL HIGH (ref 4.8–5.6)
Mean Plasma Glucose: 151 mg/dL

## 2022-03-24 NOTE — H&P (Signed)
Chief Complaint  Patient presents with   Shoulder Pain      Rt shoulder     Robin Bridges is 77 years old she is asked for an appointment to be reevaluated for possible surgery Right shoulder   MRI findings listed below.  My interpretation was that the right shoulder did have a tear supraspinatus which is differing from the radiology report.  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   Mixed hyperlipidemia 11/12/2015   Neuromuscular disorder ([place])    Osteoporosis    Stroke ([place])    Ulcer    Past Surgical History:  Procedure Laterality Date   ABDOMINAL HYSTERECTOMY     COLONOSCOPY N/A 02/19/2017   Procedure: COLONOSCOPY;  Surgeon: Rogene Houston, MD;  Location: [facility];  Service: Endoscopy;  Laterality: N/A;  830   EYE SURGERY     FRACTURE SURGERY     TUBAL LIGATION     Family History  Problem Relation Age of Onset   Breast cancer Maternal Aunt    Social History   Tobacco Use   Smoking status: Former    Types: Cigars   Smokeless tobacco: Never  Substance Use Topics   Alcohol use: Yes    Alcohol/week: 1.0 standard drink of alcohol    Types: 1 Cans of beer per week    Comment: some days   Drug use: Yes    Types: Marijuana    Comment: here and there for pain   Current Outpatient Medications  Medication Instructions   albuterol (VENTOLIN HFA) 108 (90 Base) MCG/ACT inhaler 2 puffs, Inhalation, Every 6 hours PRN   ALPRAZolam (XANAX) 0.5 MG tablet TAKE 1 TABLET BY MOUTH TWICE DAILY AS NEEDED FOR ANXIETY   amLODipine (NORVASC) 10 mg, Oral, Daily   aspirin EC 81 mg, Oral, Daily   atorvastatin (LIPITOR) 20 mg,  Oral, Daily   benzonatate (TESSALON) 100 mg, Oral, 2 times daily PRN   Budeson-Glycopyrrol-Formoterol (BREZTRI AEROSPHERE) 160-9-4.8 MCG/ACT AERO 2 puffs, Inhalation, 2 times daily   cholecalciferol (VITAMIN D3) 1,000 Units, Oral, Daily   fluticasone (FLONASE) 50 MCG/ACT nasal spray 2 sprays, Each Nare, Daily   hydrochlorothiazide (MICROZIDE) 12.5 MG capsule TAKE ONE CAPSULE BY MOUTH EVERY DAY   metFORMIN (GLUCOPHAGE-XR) 500 mg, Oral, Daily with breakfast   montelukast (SINGULAIR) 10 mg, Oral, Daily at bedtime   naproxen (NAPROSYN) 500 mg, Oral, 2 times daily with meals   pantoprazole (PROTONIX) 40 mg, Oral, Daily   polyvinyl alcohol (LIQUIFILM TEARS) 1.4 % ophthalmic solution 1 drop, Both Eyes, As needed   tacrolimus (PROTOPIC) 0.1 % ointment Topical, 2 times daily   triamcinolone cream (KENALOG) 0.1 % 1 Application, Topical, 2 times daily   venlafaxine XR (EFFEXOR-XR) 75 mg, Oral, Daily with breakfast, Take one capsule by mouth every day   She is awake alert and oriented x 3  Mood affect normal  Cardiovascular exam normal pulse perfusion normal capillary refill good color  Neurologic exam normal sensation  Normal reflexes  Examination of the right shoulder   Reexamination shows that she cannot quite get the arm to 90 degrees is really about  60 degrees and abduction flexion is 100 degrees she is weak in flexion not so much in abduction   She is tender around the anterior shoulder and anterolateral acromion and deltoid   She is saying that she cannot tolerate the shoulder the way it is anymore and would like to proceed with surgery   We discussed the following   Same-day surgery   She has help at home   Sling 4 weeks after surgery   Physical therapy   Arthroscopic evaluation will determine if there is a tear and how big and how much of repair is needed   Assessment and plan       Encounter Diagnoses  Name Primary?   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 Mild tendinosis of the infraspinatus tendon. Teres minor tendon is intact. Subscapularis tendon is intact.   Muscles: No muscle atrophy or edema. No intramuscular fluid collection or hematoma.   Biceps Long Head: Intraarticular and extraarticular portions of the biceps tendon are intact.   Acromioclavicular Joint: Moderate arthropathy of the acromioclavicular joint. No subacromial/subdeltoid bursal fluid.   Glenohumeral Joint: No joint effusion. No chondral defect.   Labrum: Grossly intact, but evaluation is limited by lack of intraarticular fluid/contrast.   Bones: No fracture or dislocation. No aggressive osseous lesion. Intraosseous cyst at the base of the coracoid process.   Other: No fluid collection or hematoma.   IMPRESSION: 1. Severe tendinosis of the supraspinatus tendon. 2. Mild tendinosis of the infraspinatus tendon.     Electronically Signed   By: Kathreen Devoid M.D.   On: 01/17/2022 05:44

## 2022-03-25 ENCOUNTER — Ambulatory Visit (HOSPITAL_COMMUNITY): Payer: Medicare PPO | Admitting: Certified Registered"

## 2022-03-25 ENCOUNTER — Encounter (HOSPITAL_COMMUNITY)
Admission: RE | Disposition: A | Discharge status: Home / Self Care | Payer: Self-pay | Source: Home / Self Care | Attending: Orthopedic Surgery

## 2022-03-25 ENCOUNTER — Encounter (HOSPITAL_COMMUNITY): Payer: Self-pay | Admitting: Orthopedic Surgery

## 2022-03-25 ENCOUNTER — Ambulatory Visit (HOSPITAL_COMMUNITY)
Admission: RE | Admit: 2022-03-25 | Discharge: 2022-03-25 | Disposition: A | Discharge status: Home / Self Care | Payer: Medicare PPO | Attending: Orthopedic Surgery | Admitting: Orthopedic Surgery

## 2022-03-25 ENCOUNTER — Other Ambulatory Visit: Payer: Self-pay

## 2022-03-25 ENCOUNTER — Ambulatory Visit (HOSPITAL_BASED_OUTPATIENT_CLINIC_OR_DEPARTMENT_OTHER): Payer: Medicare PPO | Admitting: Certified Registered"

## 2022-03-25 DIAGNOSIS — Z8673 Personal history of transient ischemic attack (TIA), and cerebral infarction without residual deficits: Secondary | ICD-10-CM | POA: Diagnosis not present

## 2022-03-25 DIAGNOSIS — M7581 Other shoulder lesions, right shoulder: Secondary | ICD-10-CM | POA: Insufficient documentation

## 2022-03-25 DIAGNOSIS — F419 Anxiety disorder, unspecified: Secondary | ICD-10-CM | POA: Diagnosis not present

## 2022-03-25 DIAGNOSIS — I10 Essential (primary) hypertension: Secondary | ICD-10-CM | POA: Insufficient documentation

## 2022-03-25 DIAGNOSIS — M75111 Incomplete rotator cuff tear or rupture of right shoulder, not specified as traumatic: Secondary | ICD-10-CM | POA: Diagnosis not present

## 2022-03-25 DIAGNOSIS — Z87891 Personal history of nicotine dependence: Secondary | ICD-10-CM | POA: Insufficient documentation

## 2022-03-25 DIAGNOSIS — M75101 Unspecified rotator cuff tear or rupture of right shoulder, not specified as traumatic: Secondary | ICD-10-CM

## 2022-03-25 DIAGNOSIS — K219 Gastro-esophageal reflux disease without esophagitis: Secondary | ICD-10-CM | POA: Insufficient documentation

## 2022-03-25 DIAGNOSIS — M7521 Bicipital tendinitis, right shoulder: Secondary | ICD-10-CM

## 2022-03-25 DIAGNOSIS — J449 Chronic obstructive pulmonary disease, unspecified: Secondary | ICD-10-CM | POA: Diagnosis not present

## 2022-03-25 DIAGNOSIS — F32A Depression, unspecified: Secondary | ICD-10-CM | POA: Insufficient documentation

## 2022-03-25 DIAGNOSIS — M25511 Pain in right shoulder: Secondary | ICD-10-CM

## 2022-03-25 HISTORY — PX: SHOULDER ARTHROSCOPY WITH OPEN ROTATOR CUFF REPAIR: SHX6092

## 2022-03-25 LAB — GLUCOSE, CAPILLARY
Glucose-Capillary: 120 mg/dL — ABNORMAL HIGH (ref 70–99)
Glucose-Capillary: 138 mg/dL — ABNORMAL HIGH (ref 70–99)

## 2022-03-25 SURGERY — ARTHROSCOPY, SHOULDER WITH REPAIR, ROTATOR CUFF, OPEN
Anesthesia: General | Site: Shoulder | Laterality: Right

## 2022-03-25 MED ORDER — LIDOCAINE HCL (CARDIAC) PF 50 MG/5ML IV SOSY
PREFILLED_SYRINGE | INTRAVENOUS | Status: DC | PRN
  Administered 2022-03-25: 80 mg via INTRAVENOUS
  Administered 2022-03-25: 100 mg via INTRAVENOUS

## 2022-03-25 MED ORDER — FENTANYL CITRATE (PF) 100 MCG/2ML IJ SOLN
INTRAMUSCULAR | Status: AC
  Filled 2022-03-25: qty 2

## 2022-03-25 MED ORDER — ONDANSETRON HCL 4 MG/2ML IJ SOLN: 4.0000 mg | Freq: Once | INTRAMUSCULAR | Status: DC | PRN

## 2022-03-25 MED ORDER — CEFAZOLIN SODIUM-DEXTROSE 2-4 GM/100ML-% IV SOLN
2.0000 g | INTRAVENOUS | Status: AC
  Administered 2022-03-25: 2 g via INTRAVENOUS

## 2022-03-25 MED ORDER — MIDAZOLAM HCL 5 MG/5ML IJ SOLN
INTRAMUSCULAR | Status: DC | PRN
  Administered 2022-03-25: 2 mg via INTRAVENOUS

## 2022-03-25 MED ORDER — BUPIVACAINE-EPINEPHRINE 0.5% -1:200000 IJ SOLN
INTRAMUSCULAR | Status: DC | PRN
  Administered 2022-03-25: 15 mL

## 2022-03-25 MED ORDER — OXYCODONE HCL 5 MG PO TABS: 5.0000 mg | ORAL_TABLET | Freq: Once | ORAL | Status: DC | PRN

## 2022-03-25 MED ORDER — ROCURONIUM 10MG/ML (10ML) SYRINGE FOR MEDFUSION PUMP - OPTIME
INTRAVENOUS | Status: DC | PRN
  Administered 2022-03-25: 50 mg via INTRAVENOUS
  Administered 2022-03-25: 20 mg via INTRAVENOUS

## 2022-03-25 MED ORDER — ORAL CARE MOUTH RINSE: 15.0000 mL | Freq: Once | OROMUCOSAL | Status: DC

## 2022-03-25 MED ORDER — LIDOCAINE HCL (PF) 2 % IJ SOLN
INTRAMUSCULAR | Status: AC
  Filled 2022-03-25: qty 5

## 2022-03-25 MED ORDER — ROCURONIUM BROMIDE 10 MG/ML (PF) SYRINGE
PREFILLED_SYRINGE | INTRAVENOUS | Status: AC
  Filled 2022-03-25: qty 10

## 2022-03-25 MED ORDER — LACTATED RINGERS IV SOLN: INTRAVENOUS | Status: DC | PRN

## 2022-03-25 MED ORDER — 0.9 % SODIUM CHLORIDE (POUR BTL) OPTIME
TOPICAL | Status: DC | PRN
  Administered 2022-03-25: 1000 mL

## 2022-03-25 MED ORDER — CEFAZOLIN SODIUM-DEXTROSE 2-4 GM/100ML-% IV SOLN
INTRAVENOUS | Status: AC
  Filled 2022-03-25: qty 100

## 2022-03-25 MED ORDER — TIZANIDINE HCL 4 MG PO TABS: 4.0000 mg | ORAL_TABLET | Freq: Four times a day (QID) | ORAL | 0 refills | Status: DC | PRN

## 2022-03-25 MED ORDER — SUGAMMADEX SODIUM 200 MG/2ML IV SOLN
INTRAVENOUS | Status: DC | PRN
  Administered 2022-03-25: 200 mg via INTRAVENOUS

## 2022-03-25 MED ORDER — OXYCODONE HCL 5 MG/5ML PO SOLN: 5.0000 mg | Freq: Once | ORAL | Status: DC | PRN

## 2022-03-25 MED ORDER — SODIUM CHLORIDE 0.9 % IR SOLN
Status: DC | PRN
  Administered 2022-03-25 (×2): 3000 mL

## 2022-03-25 MED ORDER — ONDANSETRON HCL 4 MG/2ML IJ SOLN
INTRAMUSCULAR | Status: AC
  Filled 2022-03-25: qty 2

## 2022-03-25 MED ORDER — MIDAZOLAM HCL 2 MG/2ML IJ SOLN
INTRAMUSCULAR | Status: AC
  Filled 2022-03-25: qty 2

## 2022-03-25 MED ORDER — VASOPRESSIN 20 UNIT/ML IV SOLN
INTRAVENOUS | Status: AC
  Filled 2022-03-25: qty 1

## 2022-03-25 MED ORDER — ROPIVACAINE HCL 5 MG/ML IJ SOLN
INTRAMUSCULAR | Status: AC
  Filled 2022-03-25: qty 30

## 2022-03-25 MED ORDER — FENTANYL CITRATE (PF) 100 MCG/2ML IJ SOLN
INTRAMUSCULAR | Status: DC | PRN
  Administered 2022-03-25: 25 ug via INTRAVENOUS
  Administered 2022-03-25: 50 ug via INTRAVENOUS

## 2022-03-25 MED ORDER — BUPIVACAINE-EPINEPHRINE (PF) 0.5% -1:200000 IJ SOLN
INTRAMUSCULAR | Status: AC
  Filled 2022-03-25: qty 30

## 2022-03-25 MED ORDER — CHLORHEXIDINE GLUCONATE 0.12 % MT SOLN: 15.0000 mL | Freq: Once | OROMUCOSAL | Status: DC

## 2022-03-25 MED ORDER — ONDANSETRON HCL 4 MG/2ML IJ SOLN
INTRAMUSCULAR | Status: DC | PRN
  Administered 2022-03-25: 4 mg via INTRAVENOUS

## 2022-03-25 MED ORDER — ARTIFICIAL TEARS OPHTHALMIC OINT
TOPICAL_OINTMENT | OPHTHALMIC | Status: AC
  Filled 2022-03-25: qty 3.5

## 2022-03-25 MED ORDER — PROPOFOL 10 MG/ML IV BOLUS
INTRAVENOUS | Status: DC | PRN
  Administered 2022-03-25: 150 mg via INTRAVENOUS

## 2022-03-25 MED ORDER — FENTANYL CITRATE PF 50 MCG/ML IJ SOSY: 25.0000 ug | PREFILLED_SYRINGE | INTRAMUSCULAR | Status: DC | PRN

## 2022-03-25 MED ORDER — PHENYLEPHRINE HCL-NACL 20-0.9 MG/250ML-% IV SOLN
INTRAVENOUS | Status: AC
  Filled 2022-03-25: qty 250

## 2022-03-25 MED ORDER — EPINEPHRINE PF 1 MG/ML IJ SOLN
INTRAMUSCULAR | Status: AC
  Filled 2022-03-25: qty 4

## 2022-03-25 MED ORDER — IBUPROFEN 800 MG PO TABS: 800.0000 mg | ORAL_TABLET | Freq: Three times a day (TID) | ORAL | 1 refills | Status: DC | PRN

## 2022-03-25 MED ORDER — EPHEDRINE 5 MG/ML INJ
INTRAVENOUS | Status: AC
  Filled 2022-03-25: qty 5

## 2022-03-25 MED ORDER — LACTATED RINGERS IV SOLN: INTRAVENOUS | Status: DC

## 2022-03-25 MED ORDER — OXYCODONE HCL 5 MG PO TABS: 5.0000 mg | ORAL_TABLET | Freq: Four times a day (QID) | ORAL | 0 refills | Status: AC | PRN

## 2022-03-25 SURGICAL SUPPLY — 66 items
ANCH SUT SWLK 19.1X4.75 (Anchor) ×1 IMPLANT
ANCHOR SUT BIO SW 4.75X19.1 (Anchor) IMPLANT
APL PRP STRL LF DISP 70% ISPRP (MISCELLANEOUS) ×1
APL SKNCLS STERI-STRIP NONHPOA (GAUZE/BANDAGES/DRESSINGS) ×1
BENZOIN TINCTURE PRP APPL 2/3 (GAUZE/BANDAGES/DRESSINGS) IMPLANT
BLADE SHAVER TORPEDO 4X13 (MISCELLANEOUS) IMPLANT
BLADE SURG 15 STRL LF DISP TIS (BLADE) IMPLANT
BLADE SURG 15 STRL SS (BLADE) ×1
BLADE SURG SZ10 CARB STEEL (BLADE) ×1 IMPLANT
BLADE SURG SZ11 CARB STEEL (BLADE) ×1 IMPLANT
BNDG CMPR 5X4 CHSV STRCH STRL (GAUZE/BANDAGES/DRESSINGS) ×1
BNDG COHESIVE 4X5 TAN STRL LF (GAUZE/BANDAGES/DRESSINGS) IMPLANT
BURR OVAL 8 FLU 4.0X13 (MISCELLANEOUS) IMPLANT
CANNULA DRILOCK 6.5X75 (CANNULA) IMPLANT
CHLORAPREP W/TINT 26 (MISCELLANEOUS) ×1 IMPLANT
CLOTH BEACON ORANGE TIMEOUT ST (SAFETY) ×1 IMPLANT
COVER LIGHT HANDLE STERIS (MISCELLANEOUS) ×4 IMPLANT
COVER MAYO STAND XLG (MISCELLANEOUS) IMPLANT
DECANTER SPIKE VIAL GLASS SM (MISCELLANEOUS) ×2 IMPLANT
DRAPE HALF SHEET 40X57 (DRAPES) ×1 IMPLANT
DRAPE SHOULDER BEACH CHAIR (DRAPES) ×1 IMPLANT
DRAPE U-SHAPE 47X51 STRL (DRAPES) ×1 IMPLANT
DRSG MEPILEX FLEX 6X6 (GAUZE/BANDAGES/DRESSINGS) IMPLANT
ELECT REM PT RETURN 9FT ADLT (ELECTROSURGICAL) ×1
ELECTRODE REM PT RTRN 9FT ADLT (ELECTROSURGICAL) ×1 IMPLANT
GAUZE 4X4 16PLY ~~LOC~~+RFID DBL (SPONGE) ×1 IMPLANT
GLOVE BIOGEL PI IND STRL 7.0 (GLOVE) ×2 IMPLANT
GLOVE BIOGEL PI IND STRL 7.5 (GLOVE) IMPLANT
GLOVE ECLIPSE 7.0 STRL STRAW (GLOVE) IMPLANT
GLOVE SS N UNI LF 8.5 STRL (GLOVE) ×1 IMPLANT
GLOVE SURG POLYISO LF SZ8 (GLOVE) ×1 IMPLANT
GLOVE SURG SS PI 7.0 STRL IVOR (GLOVE) IMPLANT
GOWN STRL REUS W/TWL LRG LVL3 (GOWN DISPOSABLE) ×4 IMPLANT
GOWN STRL REUS W/TWL XL LVL3 (GOWN DISPOSABLE) ×1 IMPLANT
IMMOBILIZER SHOULDER LGE (ORTHOPEDIC SUPPLIES) IMPLANT
INST SET MINOR BONE (KITS) ×1 IMPLANT
IV NS IRRIG 3000ML ARTHROMATIC (IV SOLUTION) ×2 IMPLANT
KIT BLADEGUARD II DBL (SET/KITS/TRAYS/PACK) ×1 IMPLANT
KIT POSITION SHOULDER SCHLEI (MISCELLANEOUS) ×1 IMPLANT
KIT TURNOVER KIT A (KITS) ×1 IMPLANT
MANIFOLD NEPTUNE II (INSTRUMENTS) ×1 IMPLANT
MARKER SKIN DUAL TIP RULER LAB (MISCELLANEOUS) ×1 IMPLANT
NDL HYPO 21X1.5 SAFETY (NEEDLE) ×1 IMPLANT
NEEDLE HYPO 21X1.5 SAFETY (NEEDLE) ×1 IMPLANT
PACK BASIC III (CUSTOM PROCEDURE TRAY) ×1
PACK SRG BSC III STRL LF ECLPS (CUSTOM PROCEDURE TRAY) ×1 IMPLANT
PAD ARMBOARD 7.5X6 YLW CONV (MISCELLANEOUS) ×1 IMPLANT
PENCIL SMOKE EVACUATOR (MISCELLANEOUS) IMPLANT
PORT APPOLLO RF 90DEGREE MULTI (SURGICAL WAND) ×1 IMPLANT
SET ARTHROSCOPY INST (INSTRUMENTS) ×1 IMPLANT
SET BASIN LINEN APH (SET/KITS/TRAYS/PACK) ×1 IMPLANT
SPONGE T-LAP 18X18 ~~LOC~~+RFID (SPONGE) IMPLANT
STOCKINETTE IMPERVIOUS LG (DRAPES) IMPLANT
STRIP CLOSURE SKIN 1/2X4 (GAUZE/BANDAGES/DRESSINGS) IMPLANT
SUT ETHIBOND NAB OS 4 #2 30IN (SUTURE) IMPLANT
SUT ETHILON 3 0 FSL (SUTURE) IMPLANT
SUT MON AB 0 CT1 (SUTURE) ×1 IMPLANT
SUT MON AB 2-0 CT1 36 (SUTURE) IMPLANT
SUT PDS AB CT VIOLET #0 27IN (SUTURE) IMPLANT
SUTURE TAPE 1.3 40 TPR END (SUTURE) IMPLANT
SUTURETAPE 1.3 40 TPR END (SUTURE) ×1
SYR 30ML LL (SYRINGE) ×1 IMPLANT
SYR BULB IRRIG 60ML STRL (SYRINGE) ×2 IMPLANT
TUBING IN/OUT FLOW W/MAIN PUMP (TUBING) ×1 IMPLANT
YANKAUER SUCT 12FT TUBE ARGYLE (SUCTIONS) ×1 IMPLANT
YANKAUER SUCT BULB TIP 10FT TU (MISCELLANEOUS) ×2 IMPLANT

## 2022-03-25 NOTE — Interval H&P Note (Signed)
History and Physical Interval Note:  03/25/2022 12:54 PM  Robin Bridges  has presented today for surgery, with the diagnosis of Right shoulder rotator cuff tear and biceps tendonitis.  The various methods of treatment have been discussed with the patient and family. After consideration of risks, benefits and other options for treatment, the patient has consented to  Procedure(s): SHOULDER ARTHROSCOPY WITH MINI OPEN ROTATOR CUFF REPAIR (Right) as a surgical intervention.  The patient's history has been reviewed, patient examined, no change in status, stable for surgery.  I have reviewed the patient's chart and labs.  Questions were answered to the patient's satisfaction.     Arther Abbott

## 2022-03-25 NOTE — Anesthesia Procedure Notes (Signed)
Procedure Name: Intubation Date/Time: 03/25/2022 1:32 PM  Performed by: Ollen Bowl, CRNAPre-anesthesia Checklist: Patient identified, Patient being monitored, Timeout performed, Emergency Drugs available and Suction available Patient Re-evaluated:Patient Re-evaluated prior to induction Oxygen Delivery Method: Circle system utilized Preoxygenation: Pre-oxygenation with 100% oxygen Induction Type: IV induction Ventilation: Mask ventilation without difficulty Laryngoscope Size: Mac and 3 Grade View: Grade I Tube type: Oral Tube size: 7.0 mm Number of attempts: 1 Airway Equipment and Method: Stylet Placement Confirmation: ETT inserted through vocal cords under direct vision, positive ETCO2 and breath sounds checked- equal and bilateral Secured at: 21 cm Tube secured with: Tape Dental Injury: Teeth and Oropharynx as per pre-operative assessment

## 2022-03-25 NOTE — Transfer of Care (Signed)
Immediate Anesthesia Transfer of Care Note  Patient: Robin Bridges  Procedure(s) Performed: SHOULDER ARTHROSCOPY WITH MINI OPEN ROTATOR CUFF REPAIR (Right: Shoulder)  Patient Location: PACU  Anesthesia Type:General  Level of Consciousness: awake and patient cooperative  Airway & Oxygen Therapy: Patient Spontanous Breathing  Post-op Assessment: Report given to RN and Post -op Vital signs reviewed and stable  Post vital signs: Reviewed and stable  Last Vitals:  Vitals Value Taken Time  BP 152/89 03/25/22 1517  Temp 98.4 1520  Pulse 85 03/25/22 1519  Resp 20 03/25/22 1519  SpO2 96 % 03/25/22 1519  Vitals shown include unvalidated device data.  Last Pain:  Vitals:   03/25/22 1141  PainSc: 0-No pain         Complications: No notable events documented.

## 2022-03-25 NOTE — Progress Notes (Signed)
Interscalene block right shoulder done per Dr Briant Cedar. 1250-time out performed 1252-procedure start 1302-procedure end  Tolerated well.

## 2022-03-25 NOTE — Op Note (Addendum)
Orthopaedic Surgery Operative Note (CSN: [id number])  Robin Bridges  1945-05-23 Date of Surgery: 03/25/2022   Diagnoses:  Right shoulder rotator cuff tear and biceps tendonitis  Procedure:   PRIMARY: Mini open rotator cuff repair  SECONDARY: Arthroscopy right shoulder intra-articular debridement of biceps tendon subacromial bursectomy and acromioplasty and debridement of undersurface articular sided rotator cuff; (Arthroscopy shoulder debridement limited to discrete structures   Operative Finding Successful completion of the planned procedure.     Post-Op Diagnosis: Same Surgeons:Primary: Carole Civil, MD Assistants: None Location: [facility] OR ROOM 3 Anesthesia: General with interscalene block Antibiotics: Ancef 2 g with local vancomycin powder 1 g at the surgical site Tourniquet time: none Estimated Blood Loss: minimal Complications: None Specimens: None Implants: Implant Name Type Inv. Item Serial No. Manufacturer Lot No. LRB No. Used Action  ANCHOR SUT BIO SW [id number] - [id number] Anchor ANCHOR SUT BIO SW [id number]  Rolinda Roan [id number] Right 1 Implanted    Indications for Surgery:   Robin Bridges is a 77 y.o. female who had persistent right shoulder pain severe tendinitis on MRI and failure of nonoperative treatment.  She called the office and asked to be reevaluated and after that evaluation she decided to proceed with surgical intervention.  Benefits and risks of operative and nonoperative management were discussed prior to surgery with patient/guardian(s) and informed consent form was completed.  Specific risks including infection, need for additional surgery,    Procedure:   The patient was identified properly. Informed consent was obtained and the surgical site was marked. The patient was taken to the OR where general anesthesia was induced.  The patient was positioned in the modified beachchair position and the Schlein shoulder positioner.  The right shoulder was  examined under anesthesia and was found to be stable with full range of motion passively, the right arm was prepped and draped in the usual sterile fashion.  Timeout was performed before the beginning of the case.  I made a standard posterior portals by first injecting the skin and then going posteriorly through a small stab incision.  I entered the joint and did a diagnostic arthroscopy  The most notable findings were there was fraying of the biceps tendon and the substance not at the anchor.  There was also undersurface rotator cuff tear posterior to the biceps tendon.  The glenohumeral joint and labrum were normal  I then placed the scope into the subacromial space through the same posterior portal.  The I performed a bursectomy through a lateral portal and an acromioplasty.  I evaluated the [facility] joint and found it was not in need of any further treatment  I then took extra time to evaluate the rotator cuff from the bursal side.  I saw some very thin cuff tissue and decided to place the scope back into the joint.  I then placed a spinal needle into the undersurface tear area and pull that suture out through the anterior portal.  I then went back in the subacromial space and identified the tissue that was marked by the suture.  This corresponded to the tissue that looked to be very thin from the bursal side  I then made an anterolateral incision off the tip of the acromion divided the subcutaneous tissue found the suture that was the marking suture and pulled it out through the anterior portal  I then continued dissection down to the deltoid made a deltoid split without detachment and then found the suture detach the cuff at the  weak point posterior to the biceps tendon.  I then debrided the tuberosity and then passed a suture tape in an inverted mattress fashion.  I then clean the area of the humerus where the anchor would go.  I then punched for the anchor and then placed the sutures in the anchor and  then placed the anchor.  Prior to removing the handle on the anchor I checked to make sure the anchor was down.  I also checked the tension on the sutures and found the repair to be intact  I then irrigated the wound I closed the deltoid split with a running 0 Monocryl suture and then I made an 0 Monocryl interrupted layer for the subcu tissue and then a 2-0 Monocryl for the skin.  I closed the portals with 3-0 nylon.  I placed the patient in a sling and swath  She was extubated and taken to recovery room in stable condition   Post-operative plan:   Discharge home from the PACU once they have recovered DVT prophylaxis not indicated in this ambulatory upper extremity patient without significant risk factors.    Pain control with PRN pain medication preferring oral medicines.   Follow up plan will be scheduled in approximately 7 days for incision check

## 2022-03-25 NOTE — Progress Notes (Signed)
Instructed on incentive spirometer. 1500 ml obtained. Tolerated well. 

## 2022-03-25 NOTE — Brief Op Note (Signed)
03/25/2022  3:14 PM  PATIENT:  Robin Bridges  77 y.o. female  PRE-OPERATIVE DIAGNOSIS:  Right shoulder rotator cuff tear and biceps tendonitis  POST-OPERATIVE DIAGNOSIS:  Right shoulder rotator cuff tear and biceps tendonitis  PROCEDURE:  Procedure(s): SHOULDER ARTHROSCOPY WITH MINI OPEN ROTATOR CUFF REPAIR (Right) Intra-articular debridement of biceps tendon Subacromial bursectomy and acromioplasty    SURGEON:  Surgeon(s) and Role:    Carole Civil, MD - Primary  PHYSICIAN ASSISTANT:   ASSISTANTS: none   ANESTHESIA:   general and paracervical block  EBL:  15 mL   BLOOD ADMINISTERED:none  DRAINS: none   LOCAL MEDICATIONS USED:  MARCAINE     SPECIMEN:  No Specimen  DISPOSITION OF SPECIMEN:  N/A  COUNTS:  YES  TOURNIQUET:  * No tourniquets in log *  DICTATION: .Dragon Dictation  PLAN OF CARE: Discharge to home after PACU  PATIENT DISPOSITION:  PACU - hemodynamically stable.   Delay start of Pharmacological VTE agent (>24hrs) due to surgical blood loss or risk of bleeding: not applicable

## 2022-03-25 NOTE — Anesthesia Preprocedure Evaluation (Signed)
Anesthesia Evaluation  Patient identified by MRN, date of birth, ID band Patient awake    Reviewed: Allergy & Precautions, H&P , NPO status , Patient's Chart, lab work & pertinent test results, reviewed documented beta blocker date and time   Airway Mallampati: II  TM Distance: >3 FB Neck ROM: full    Dental no notable dental hx.    Pulmonary neg pulmonary ROS, asthma , COPD, former smoker   Pulmonary exam normal breath sounds clear to auscultation       Cardiovascular Exercise Tolerance: Good hypertension, negative cardio ROS  Rhythm:regular Rate:Normal     Neuro/Psych  PSYCHIATRIC DISORDERS Anxiety Depression     Neuromuscular disease CVA negative neurological ROS  negative psych ROS   GI/Hepatic negative GI ROS, Neg liver ROS,GERD  ,,  Endo/Other  negative endocrine ROSdiabetes    Renal/GU negative Renal ROS  negative genitourinary   Musculoskeletal   Abdominal   Peds  Hematology negative hematology ROS (+)   Anesthesia Other Findings   Reproductive/Obstetrics negative OB ROS                             Anesthesia Physical Anesthesia Plan  ASA: 2  Anesthesia Plan: General and General ETT   Post-op Pain Management: Regional block*   Induction:   PONV Risk Score and Plan: Ondansetron  Airway Management Planned:   Additional Equipment:   Intra-op Plan:   Post-operative Plan:   Informed Consent: I have reviewed the patients History and Physical, chart, labs and discussed the procedure including the risks, benefits and alternatives for the proposed anesthesia with the patient or authorized representative who has indicated his/her understanding and acceptance.     Dental Advisory Given  Plan Discussed with: CRNA  Anesthesia Plan Comments:        Anesthesia Quick Evaluation

## 2022-03-26 NOTE — Anesthesia Postprocedure Evaluation (Signed)
Anesthesia Post Note  Patient: Robin Bridges  Procedure(s) Performed: SHOULDER ARTHROSCOPY WITH MINI OPEN ROTATOR CUFF REPAIR (Right: Shoulder)  Patient location during evaluation: Phase II Anesthesia Type: General Level of consciousness: awake Pain management: pain level controlled Vital Signs Assessment: post-procedure vital signs reviewed and stable Respiratory status: spontaneous breathing and respiratory function stable Cardiovascular status: blood pressure returned to baseline and stable Postop Assessment: no headache and no apparent nausea or vomiting Anesthetic complications: no Comments: Late entry   No notable events documented.   Last Vitals:  Vitals:   03/25/22 1545 03/25/22 1546  BP:  (!) 160/87  Pulse:  95  Resp:  17  Temp: 36.9 C   SpO2: 100%     Last Pain:  Vitals:   03/25/22 1545  PainSc: 0-No pain                 Louann Sjogren

## 2022-03-31 ENCOUNTER — Encounter (HOSPITAL_COMMUNITY): Payer: Self-pay | Admitting: Orthopedic Surgery

## 2022-04-01 DIAGNOSIS — Z9889 Other specified postprocedural states: Secondary | ICD-10-CM | POA: Insufficient documentation

## 2022-04-02 ENCOUNTER — Encounter: Payer: Self-pay | Admitting: Orthopedic Surgery

## 2022-04-02 ENCOUNTER — Ambulatory Visit (INDEPENDENT_AMBULATORY_CARE_PROVIDER_SITE_OTHER): Payer: Medicare PPO | Admitting: Orthopedic Surgery

## 2022-04-02 DIAGNOSIS — Z9889 Other specified postprocedural states: Secondary | ICD-10-CM

## 2022-04-02 NOTE — Progress Notes (Unsigned)
  Chief Complaint  Patient presents with   Post-op Follow-up    Right RCR 03/25/22 improving portal sutures removed and steri strips changed     Diagnoses:  Right shoulder rotator cuff tear and biceps tendonitis   Procedure:     PRIMARY: Mini open rotator cuff repair   SECONDARY: Arthroscopy right shoulder intra-articular debridement of biceps tendon subacromial bursectomy and acromioplasty and debridement of undersurface articular sided rotator cuff; (Arthroscopy shoulder debridement limited to discrete structures   Operative Finding Successful completion of the planned procedure.    Robin Bridges is doing well she is took her last pain pill last Thursday  Her wounds look good  She can start therapy  Follow-up in 4 to 5 weeks  I allowed her to take her sling off 3 times a day to relax her biceps

## 2022-04-10 DIAGNOSIS — M25511 Pain in right shoulder: Secondary | ICD-10-CM | POA: Diagnosis not present

## 2022-04-16 DIAGNOSIS — M25511 Pain in right shoulder: Secondary | ICD-10-CM | POA: Diagnosis not present

## 2022-04-18 DIAGNOSIS — M25511 Pain in right shoulder: Secondary | ICD-10-CM | POA: Diagnosis not present

## 2022-04-23 DIAGNOSIS — M25511 Pain in right shoulder: Secondary | ICD-10-CM | POA: Diagnosis not present

## 2022-04-25 ENCOUNTER — Other Ambulatory Visit: Payer: Self-pay | Admitting: Internal Medicine

## 2022-04-25 DIAGNOSIS — E782 Mixed hyperlipidemia: Secondary | ICD-10-CM

## 2022-04-30 DIAGNOSIS — M25511 Pain in right shoulder: Secondary | ICD-10-CM | POA: Diagnosis not present

## 2022-05-02 ENCOUNTER — Other Ambulatory Visit: Payer: Self-pay | Admitting: Internal Medicine

## 2022-05-02 DIAGNOSIS — J441 Chronic obstructive pulmonary disease with (acute) exacerbation: Secondary | ICD-10-CM

## 2022-05-02 DIAGNOSIS — M25511 Pain in right shoulder: Secondary | ICD-10-CM | POA: Diagnosis not present

## 2022-05-07 ENCOUNTER — Ambulatory Visit (INDEPENDENT_AMBULATORY_CARE_PROVIDER_SITE_OTHER): Payer: Medicare PPO | Admitting: Orthopedic Surgery

## 2022-05-07 DIAGNOSIS — Z9889 Other specified postprocedural states: Secondary | ICD-10-CM

## 2022-05-07 DIAGNOSIS — M25511 Pain in right shoulder: Secondary | ICD-10-CM | POA: Diagnosis not present

## 2022-05-07 NOTE — Progress Notes (Signed)
POST OP VISIT   Encounter Diagnosis  Name Primary?   S/P right rotator cuff repair 03/25/22 Yes    Chief Complaint  Patient presents with   Post-op Follow-up    03/25/22 right rotator cuff tear    PRIMARY: Mini open rotator cuff repair   SECONDARY:  Arthroscopy right shoulder intra-articular debridement of biceps tendon,   subacromial bursectomy and acromioplasty;  and debridement of undersurface articular sided rotator cuff; (Arthroscopy shoulder debridement limited to discrete structures    POD  # 43, week 6  [facility]  The patient brought in a report her passive range of motion is well past 150 degrees her pain is well-controlled  She can come out of the sling she should continue therapy follow-up with me in 6 weeks

## 2022-05-09 DIAGNOSIS — M25511 Pain in right shoulder: Secondary | ICD-10-CM | POA: Diagnosis not present

## 2022-05-14 DIAGNOSIS — M25511 Pain in right shoulder: Secondary | ICD-10-CM | POA: Diagnosis not present

## 2022-05-16 DIAGNOSIS — M25511 Pain in right shoulder: Secondary | ICD-10-CM | POA: Diagnosis not present

## 2022-05-21 DIAGNOSIS — M25511 Pain in right shoulder: Secondary | ICD-10-CM | POA: Diagnosis not present

## 2022-05-23 DIAGNOSIS — M25511 Pain in right shoulder: Secondary | ICD-10-CM | POA: Diagnosis not present

## 2022-05-26 ENCOUNTER — Other Ambulatory Visit: Payer: Self-pay | Admitting: Internal Medicine

## 2022-05-26 DIAGNOSIS — F411 Generalized anxiety disorder: Secondary | ICD-10-CM

## 2022-05-27 ENCOUNTER — Other Ambulatory Visit: Payer: Self-pay | Admitting: Internal Medicine

## 2022-05-27 DIAGNOSIS — F411 Generalized anxiety disorder: Secondary | ICD-10-CM

## 2022-05-27 MED ORDER — VENLAFAXINE HCL ER 75 MG PO CP24: 75.0000 mg | ORAL_CAPSULE | Freq: Every day | ORAL | 0 refills | Status: DC

## 2022-05-28 DIAGNOSIS — M25511 Pain in right shoulder: Secondary | ICD-10-CM | POA: Diagnosis not present

## 2022-05-30 DIAGNOSIS — M25511 Pain in right shoulder: Secondary | ICD-10-CM | POA: Diagnosis not present

## 2022-06-04 DIAGNOSIS — M25511 Pain in right shoulder: Secondary | ICD-10-CM | POA: Diagnosis not present

## 2022-06-05 ENCOUNTER — Other Ambulatory Visit (HOSPITAL_COMMUNITY): Payer: Self-pay | Admitting: Internal Medicine

## 2022-06-05 DIAGNOSIS — Z1231 Encounter for screening mammogram for malignant neoplasm of breast: Secondary | ICD-10-CM

## 2022-06-06 DIAGNOSIS — M25511 Pain in right shoulder: Secondary | ICD-10-CM | POA: Diagnosis not present

## 2022-06-11 DIAGNOSIS — M25511 Pain in right shoulder: Secondary | ICD-10-CM | POA: Diagnosis not present

## 2022-06-18 ENCOUNTER — Encounter: Payer: Medicare PPO | Admitting: Orthopedic Surgery

## 2022-06-19 ENCOUNTER — Encounter: Payer: Self-pay | Admitting: Orthopedic Surgery

## 2022-06-19 ENCOUNTER — Ambulatory Visit (INDEPENDENT_AMBULATORY_CARE_PROVIDER_SITE_OTHER): Payer: Medicare PPO | Admitting: Orthopedic Surgery

## 2022-06-19 DIAGNOSIS — Z9889 Other specified postprocedural states: Secondary | ICD-10-CM

## 2022-06-19 NOTE — Progress Notes (Signed)
Chief Complaint  Patient presents with   Post-op Follow-up    03/15/22 improving     Encounter Diagnosis  Name Primary?   S/P right rotator cuff repair 03/25/22 Yes   Day 86 still in the postop global period   Diagnoses:  Right shoulder rotator cuff tear and biceps tendonitis   Procedure:     PRIMARY: Mini open rotator cuff repair   SECONDARY: Arthroscopy right shoulder intra-articular debridement of biceps tendon subacromial bursectomy and acromioplasty and debridement of undersurface articular sided rotator cuff; (Arthroscopy shoulder debridement limited to discrete structures   Operative Finding Successful completion of the planned procedure.   Robin Bridges is doing well she finished her formal therapy she is on a home program her active flexion is 110 passive is 120 abduction is about 90 external rotation is 40  She is having no major pain  Recommend home exercise program follow-up in 3 months

## 2022-06-20 ENCOUNTER — Other Ambulatory Visit: Payer: Self-pay | Admitting: Internal Medicine

## 2022-06-20 DIAGNOSIS — J309 Allergic rhinitis, unspecified: Secondary | ICD-10-CM

## 2022-06-20 DIAGNOSIS — K219 Gastro-esophageal reflux disease without esophagitis: Secondary | ICD-10-CM

## 2022-06-24 ENCOUNTER — Other Ambulatory Visit: Payer: Self-pay | Admitting: Internal Medicine

## 2022-06-24 DIAGNOSIS — I10 Essential (primary) hypertension: Secondary | ICD-10-CM

## 2022-06-26 DIAGNOSIS — E1169 Type 2 diabetes mellitus with other specified complication: Secondary | ICD-10-CM | POA: Diagnosis not present

## 2022-06-26 DIAGNOSIS — E782 Mixed hyperlipidemia: Secondary | ICD-10-CM | POA: Diagnosis not present

## 2022-06-27 ENCOUNTER — Other Ambulatory Visit: Payer: Self-pay | Admitting: Internal Medicine

## 2022-06-27 LAB — CMP14+EGFR
ALT: 27 IU/L (ref 0–32)
AST: 27 IU/L (ref 0–40)
Albumin/Globulin Ratio: 2.1 (ref 1.2–2.2)
Albumin: 4.7 g/dL (ref 3.8–4.8)
Alkaline Phosphatase: 54 IU/L (ref 44–121)
BUN/Creatinine Ratio: 23 (ref 12–28)
BUN: 19 mg/dL (ref 8–27)
Bilirubin Total: 0.3 mg/dL (ref 0.0–1.2)
CO2: 23 mmol/L (ref 20–29)
Calcium: 9.3 mg/dL (ref 8.7–10.3)
Chloride: 105 mmol/L (ref 96–106)
Creatinine, Ser: 0.82 mg/dL (ref 0.57–1.00)
Globulin, Total: 2.2 g/dL (ref 1.5–4.5)
Glucose: 111 mg/dL — ABNORMAL HIGH (ref 70–99)
Potassium: 4.5 mmol/L (ref 3.5–5.2)
Sodium: 143 mmol/L (ref 134–144)
Total Protein: 6.9 g/dL (ref 6.0–8.5)
eGFR: 75 mL/min/{1.73_m2} (ref >59)

## 2022-06-27 LAB — LIPID PANEL
Chol/HDL Ratio: 3.2 ratio (ref 0.0–4.4)
Cholesterol, Total: 192 mg/dL (ref 100–199)
HDL: 60 mg/dL (ref >39)
LDL Chol Calc (NIH): 105 mg/dL — ABNORMAL HIGH (ref 0–99)
Triglycerides: 153 mg/dL — ABNORMAL HIGH (ref 0–149)
VLDL Cholesterol Cal: 27 mg/dL (ref 5–40)

## 2022-06-27 LAB — HEMOGLOBIN A1C
Est. average glucose Bld gHb Est-mCnc: 154 mg/dL
Hgb A1c MFr Bld: 7 % — ABNORMAL HIGH (ref 4.8–5.6)

## 2022-06-30 ENCOUNTER — Ambulatory Visit (INDEPENDENT_AMBULATORY_CARE_PROVIDER_SITE_OTHER): Payer: Medicare PPO | Admitting: Internal Medicine

## 2022-06-30 ENCOUNTER — Other Ambulatory Visit: Payer: Self-pay | Admitting: Internal Medicine

## 2022-06-30 ENCOUNTER — Encounter: Payer: Self-pay | Admitting: Internal Medicine

## 2022-06-30 VITALS — BP 129/83 | HR 98 | Ht 59.0 in | Wt 160.2 lb

## 2022-06-30 DIAGNOSIS — L600 Ingrowing nail: Secondary | ICD-10-CM

## 2022-06-30 DIAGNOSIS — I10 Essential (primary) hypertension: Secondary | ICD-10-CM | POA: Diagnosis not present

## 2022-06-30 DIAGNOSIS — F411 Generalized anxiety disorder: Secondary | ICD-10-CM

## 2022-06-30 DIAGNOSIS — J449 Chronic obstructive pulmonary disease, unspecified: Secondary | ICD-10-CM

## 2022-06-30 DIAGNOSIS — E1169 Type 2 diabetes mellitus with other specified complication: Secondary | ICD-10-CM

## 2022-06-30 DIAGNOSIS — E782 Mixed hyperlipidemia: Secondary | ICD-10-CM

## 2022-06-30 DIAGNOSIS — G4733 Obstructive sleep apnea (adult) (pediatric): Secondary | ICD-10-CM | POA: Insufficient documentation

## 2022-06-30 DIAGNOSIS — K219 Gastro-esophageal reflux disease without esophagitis: Secondary | ICD-10-CM

## 2022-06-30 DIAGNOSIS — J309 Allergic rhinitis, unspecified: Secondary | ICD-10-CM

## 2022-06-30 DIAGNOSIS — Z0001 Encounter for general adult medical examination with abnormal findings: Secondary | ICD-10-CM | POA: Diagnosis not present

## 2022-06-30 HISTORY — DX: Obstructive sleep apnea (adult) (pediatric): G47.33

## 2022-06-30 MED ORDER — ATORVASTATIN CALCIUM 20 MG PO TABS: 20.0000 mg | ORAL_TABLET | Freq: Every day | ORAL | 1 refills | Status: DC

## 2022-06-30 MED ORDER — BLOOD GLUCOSE MONITOR KIT: PACK | 0 refills | Status: AC

## 2022-06-30 MED ORDER — ESOMEPRAZOLE MAGNESIUM 40 MG PO CPDR: 40.0000 mg | DELAYED_RELEASE_CAPSULE | Freq: Every day | ORAL | 3 refills | Status: DC

## 2022-06-30 NOTE — Assessment & Plan Note (Signed)
Physical exam as documented. Fasting blood tests reviewed and discussed with the patient today. Advised to get Shingrix and Tdap vaccine at local pharmacy.

## 2022-06-30 NOTE — Progress Notes (Signed)
Established Patient Office Visit  Subjective:  Patient ID: Robin Bridges, female    DOB: 1946-02-29  Age: 77 y.o. MRN: [id number]  CC:  Chief Complaint  Patient presents with   Annual Exam    HPI Robin Bridges is a 77 y.o. female with past medical history of HTN, type 2 DM, HLD, COPD, GERD, anxiety and tobacco abuse who presents for annual physical.  GAD: She has been taking Effexor and as needed Xanax.  Denies any anhedonia, SI or HI currently.  BP is well-controlled. Takes medications regularly. Patient denies headache, dizziness, chest pain, dyspnea or palpitations.  Type 2 DM: Her HbA1c is 7.0 now. She has been taking Metformin 500 mg QD. She denies polyuria and polyphagia.  She reports epigastric pain and heartburn despite taking pantoprazole.  Denies any nausea, vomiting, dysphagia or odynophagia currently.  She also reports ingrown toenail on bilateral great toe.  She requests podiatry referral.  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/13/2022   Mixed hyperlipidemia 11/12/2015   Neuromuscular disorder (HCC)    OSA (obstructive sleep apnea) 06/30/2022   Osteoporosis    Stroke (HCC)    Ulcer     Past Surgical History:  Procedure Laterality Date   ABDOMINAL HYSTERECTOMY     COLONOSCOPY N/A 02/19/2017   Procedure: COLONOSCOPY;  Surgeon: Malissa Hippo, MD;  Location: [facility];  Service: Endoscopy;  Laterality: N/A;  830   EYE SURGERY     FRACTURE SURGERY     SHOULDER ARTHROSCOPY WITH OPEN ROTATOR CUFF REPAIR Right 03/25/2022   Procedure: SHOULDER ARTHROSCOPY WITH MINI OPEN ROTATOR CUFF REPAIR;  Surgeon:  Vickki Hearing, MD;  Location: [facility];  Service: Orthopedics;  Laterality: Right;   TUBAL LIGATION      Family History  Problem Relation Age of Onset   Breast cancer Maternal Aunt     Social History   Socioeconomic History   Marital status: Single    Spouse name: Not on file   Number of children: Not on file   Years of education: Not on file   Highest education level: Not on file  Occupational History   Not on file  Tobacco Use   Smoking status: Former    Types: Cigars   Smokeless tobacco: Never  Substance and Sexual Activity   Alcohol use: Yes    Alcohol/week: 1.0 standard drink of alcohol    Types: 1 Cans of beer per week    Comment: some days   Drug use: Yes    Types: Marijuana    Comment: here and there for pain   Sexual activity: Not on file  Other Topics Concern   Not on file  Social History Narrative   Not on file   Social Determinants of Health   Financial Resource Strain: Low Risk  (10/04/2021)   Overall Financial Resource Strain (CARDIA)    Difficulty of Paying Living Expenses: Not hard at all  Food Insecurity: No Food Insecurity (10/04/2021)   Hunger Vital Sign    Worried About Running Out of Food in the Last Year: Never true  Ran Out of Food in the Last Year: Never true  Transportation Needs: No Transportation Needs (10/04/2021)   PRAPARE - Administrator, Civil Service (Medical): No    Lack of Transportation (Non-Medical): No  Physical Activity: Insufficiently Active (10/04/2021)   Exercise Vital Sign    Days of Exercise per Week: 2 days    Minutes of Exercise per Session: 30 min  Stress: No Stress Concern Present (10/04/2021)   [employer] of Occupational Health - Occupational Stress Questionnaire    Feeling of Stress : Not at all  Social Connections: Moderately Isolated (10/04/2021)   Social Connection and Isolation Panel [NHANES]    Frequency of Communication with Friends and Family: More than three times a week     Frequency of Social Gatherings with Friends and Family: Three times a week    Attends Religious Services: More than 4 times per year    Active Member of Clubs or Organizations: No    Attends Banker Meetings: Never    Marital Status: Widowed  Intimate Partner Violence: Not At Risk (10/04/2021)   Humiliation, Afraid, Rape, and Kick questionnaire    Fear of Current or Ex-Partner: No    Emotionally Abused: No    Physically Abused: No    Sexually Abused: No    Outpatient Medications Prior to Visit  Medication Sig Dispense Refill   albuterol (VENTOLIN HFA) 108 (90 Base) MCG/ACT inhaler Inhale 2 puffs into the lungs every 6 (six) hours as needed for wheezing or shortness of breath. 8 g 4   ALPRAZolam (XANAX) 0.5 MG tablet TAKE 1 TABLET BY MOUTH TWICE DAILY AS NEEDED FOR ANXIETY 60 tablet 4   amLODipine (NORVASC) 10 MG tablet TAKE 1 TABLET BY MOUTH EVERY DAY 90 tablet 3   aspirin EC 81 MG tablet Take 81 mg by mouth daily.     BREZTRI AEROSPHERE 160-9-4.8 MCG/ACT AERO INHALE TWO PUFFS BY MOUTH TWICE DAILY 10.7 g 11   cholecalciferol (VITAMIN D3) 25 MCG (1000 UNIT) tablet Take 1,000 Units by mouth daily.     fluticasone (FLONASE) 50 MCG/ACT nasal spray Place 2 sprays into both nostrils daily. (Patient taking differently: Place 2 sprays into both nostrils daily as needed for allergies.) 16 g 6   hydrochlorothiazide (MICROZIDE) 12.5 MG capsule TAKE ONE CAPSULE BY MOUTH EVERY DAY 90 capsule 1   montelukast (SINGULAIR) 10 MG tablet TAKE 1 TABLET BY MOUTH AT BEDTIME 90 tablet 3   polyvinyl alcohol (LIQUIFILM TEARS) 1.4 % ophthalmic solution Place 1 drop into both eyes as needed for dry eyes.     tacrolimus (PROTOPIC) 0.1 % ointment Apply topically 2 (two) times daily. (Patient taking differently: Apply 1 Application topically daily as needed (rash).) 100 g 0   triamcinolone cream (KENALOG) 0.1 % Apply 1 Application topically 2 (two) times daily. (Patient taking differently: Apply 1  Application topically 2 (two) times daily as needed (rash).) 30 g 0   venlafaxine XR (EFFEXOR-XR) 75 MG 24 hr capsule Take 1 capsule (75 mg total) by mouth daily with breakfast. Take one capsule by mouth every day 90 capsule 0   atorvastatin (LIPITOR) 20 MG tablet Take 1 tablet (20 mg total) by mouth daily. 90 tablet 1   metFORMIN (GLUCOPHAGE-XR) 500 MG 24 hr tablet Take 1 tablet (500 mg total) by mouth daily with breakfast. 30 tablet 5   pantoprazole (PROTONIX) 40 MG tablet TAKE 1 TABLET BY MOUTH EVERY DAY 90 tablet 3   atorvastatin (LIPITOR) 10 MG  tablet TAKE 1 TABLET BY MOUTH DAILY 90 tablet 1   No facility-administered medications prior to visit.    No Known Allergies  ROS Review of Systems  Constitutional:  Negative for chills and fever.  HENT:  Positive for congestion and postnasal drip. Negative for sinus pain and sore throat.   Eyes:  Negative for pain and discharge.  Respiratory:  Positive for cough. Negative for shortness of breath and wheezing.   Cardiovascular:  Negative for chest pain and palpitations.  Gastrointestinal:  Negative for abdominal pain, diarrhea, nausea and vomiting.  Endocrine: Negative for polydipsia and polyuria.  Genitourinary:  Negative for dysuria and hematuria.  Musculoskeletal:  Negative for neck pain and neck stiffness.  Skin:  Negative for rash.  Neurological:  Negative for dizziness and weakness.  Psychiatric/Behavioral:  Negative for agitation and behavioral problems.       Objective:    Physical Exam Vitals reviewed.  Constitutional:      General: She is not in acute distress.    Appearance: She is not diaphoretic.  HENT:     Head: Normocephalic and atraumatic.     Nose: Congestion present.     Mouth/Throat:     Mouth: Mucous membranes are moist.  Eyes:     General: No scleral icterus.    Extraocular Movements: Extraocular movements intact.  Cardiovascular:     Rate and Rhythm: Normal rate and regular rhythm.     Pulses: Normal  pulses.     Heart sounds: Normal heart sounds. No murmur heard. Pulmonary:     Breath sounds: Normal breath sounds. No wheezing or rales.  Abdominal:     Palpations: Abdomen is soft.     Tenderness: There is no abdominal tenderness.  Musculoskeletal:     Cervical back: Neck supple. No tenderness.     Right lower leg: No edema.     Left lower leg: No edema.  Feet:     Right foot:     Toenail Condition: Right toenails are abnormally thick and ingrown.     Left foot:     Toenail Condition: Left toenails are abnormally thick and ingrown.  Skin:    General: Skin is warm.     Findings: No rash.     Comments: Skin tag over neck region  Neurological:     General: No focal deficit present.     Mental Status: She is alert and oriented to person, place, and time.     Cranial Nerves: No cranial nerve deficit.     Sensory: No sensory deficit.     Motor: No weakness.  Psychiatric:        Mood and Affect: Mood normal.        Behavior: Behavior normal.     BP 129/83   Pulse 98   Ht 4\' 11"  (1.499 m)   Wt 160 lb 3.2 oz (72.7 kg)   SpO2 94%   BMI 32.36 kg/m  Wt Readings from Last 3 Encounters:  06/30/22 160 lb 3.2 oz (72.7 kg)  03/21/22 158 lb 15.2 oz (72.1 kg)  02/27/22 159 lb (72.1 kg)    No results found for: "TSH" Lab Results  Component Value Date   WBC 8.6 03/21/2022   HGB 14.1 03/21/2022   HCT 43.1 03/21/2022   MCV 87.8 03/21/2022   PLT 304 03/21/2022   Lab Results  Component Value Date   NA 143 06/26/2022   K 4.5 06/26/2022   CO2 23 06/26/2022   GLUCOSE 111 (H)  06/26/2022   BUN 19 06/26/2022   CREATININE 0.82 06/26/2022   BILITOT 0.3 06/26/2022   ALKPHOS 54 06/26/2022   AST 27 06/26/2022   ALT 27 06/26/2022   PROT 6.9 06/26/2022   ALBUMIN 4.7 06/26/2022   CALCIUM 9.3 06/26/2022   ANIONGAP 12 03/21/2022   EGFR 75 06/26/2022   Lab Results  Component Value Date   CHOL 192 06/26/2022   Lab Results  Component Value Date   HDL 60 06/26/2022   Lab Results   Component Value Date   LDLCALC 105 (H) 06/26/2022   Lab Results  Component Value Date   TRIG 153 (H) 06/26/2022   Lab Results  Component Value Date   CHOLHDL 3.2 06/26/2022   Lab Results  Component Value Date   HGBA1C 7.0 (H) 06/26/2022      Assessment & Plan:   Benign essential hypertension BP Readings from Last 1 Encounters:  06/30/22 129/83   Usually well-controlled with amlodipine and HCTZ Counseled for compliance with the medications Advised DASH diet and moderate exercise/walking, at least 150 mins/week  Chronic obstructive pulmonary disease (COPD) (HCC) Overall has been well controlled with Breztri and as needed albuterol  GAD (generalized anxiety disorder) Usually well-controlled with Effexor and Xanax PDMP reviewed, refilled Xanax Advised to avoid any illicit drug including marijuana  Encounter for general adult medical examination with abnormal findings Physical exam as documented. Fasting blood tests reviewed and discussed with the patient today. Advised to get Shingrix and Tdap vaccine at local pharmacy.  Diabetes mellitus (HCC) Lab Results  Component Value Date   HGBA1C 7.0 (H) 06/26/2022   Overall well controlled Associated with HTN and HLD Continue Metformin, increased frequency to BID Advised to follow diabetic diet On statin F/u CMP and lipid panel Diabetic eye exam: Advised to follow up with Ophthalmology for diabetic eye exam  Allergic sinusitis Symptoms consistent with allergies Continue Flonase On Singulair for allergies Nasal saline spray PRN  OSA (obstructive sleep apnea) Has snoring at nighttime and daytime fatigue STOP-BANG: 5 At high risk for moderate to severe OSA Home sleep study ordered  GERD (gastroesophageal reflux disease) On Pantoprazole, uncontrolled Has tried omeprazole as well Switch to Nexium  Ingrown toenail of right foot Referred to podiatry  Mixed hyperlipidemia Lipid profile reviewed On Atorvastatin  10 mg QD, increased dose to 20 mg    Meds ordered this encounter  Medications   atorvastatin (LIPITOR) 20 MG tablet    Sig: Take 1 tablet (20 mg total) by mouth daily.    Dispense:  90 tablet    Refill:  1    PLEASE CANCEL 10 MG DOSE.   esomeprazole (NEXIUM) 40 MG capsule    Sig: Take 1 capsule (40 mg total) by mouth daily.    Dispense:  90 capsule    Refill:  3    PLEASE CANCEL PANTOPRAZOLE.   blood glucose meter kit and supplies KIT    Sig: Dispense based on patient and insurance preference. Use up to four times daily as directed.    Dispense:  1 each    Refill:  0    Order Specific Question:   Number of strips    Answer:   100    Order Specific Question:   Number of lancets    Answer:   100    Follow-up: Return in about 4 months (around 10/30/2022) for DM (HbA1c) and HTN.    Anabel Halon, MD

## 2022-06-30 NOTE — Assessment & Plan Note (Signed)
Overall has been well controlled with Breztri and as needed albuterol

## 2022-06-30 NOTE — Patient Instructions (Addendum)
Please start taking Nexium instead of Pantoprazole.  Please continue to take medications as prescribed.  Please continue to follow low carb diet and perform moderate exercise/walking at least 150 mins/week.  Please consider getting Shingrix and Tdap vaccine at local pharmacy.

## 2022-06-30 NOTE — Assessment & Plan Note (Signed)
BP Readings from Last 1 Encounters:  06/30/22 129/83   Usually well-controlled with amlodipine and HCTZ Counseled for compliance with the medications Advised DASH diet and moderate exercise/walking, at least 150 mins/week

## 2022-06-30 NOTE — Assessment & Plan Note (Signed)
Usually well-controlled with Effexor and Xanax PDMP reviewed, refilled Xanax Advised to avoid any illicit drug including marijuana

## 2022-06-30 NOTE — Assessment & Plan Note (Addendum)
Lab Results  Component Value Date   HGBA1C 7.0 (H) 06/26/2022   Overall well controlled Associated with HTN and HLD Continue Metformin, increased frequency to BID Advised to follow diabetic diet On statin F/u CMP and lipid panel Diabetic eye exam: Advised to follow up with Ophthalmology for diabetic eye exam

## 2022-07-04 ENCOUNTER — Telehealth: Payer: Self-pay | Admitting: Internal Medicine

## 2022-07-04 NOTE — Assessment & Plan Note (Signed)
Has snoring at nighttime and daytime fatigue STOP-BANG: 5 At high risk for moderate to severe OSA Home sleep study ordered

## 2022-07-04 NOTE — Assessment & Plan Note (Addendum)
Lipid profile reviewed On Atorvastatin 10 mg QD, increased dose to 20 mg

## 2022-07-04 NOTE — Assessment & Plan Note (Signed)
Referred to podiatry.

## 2022-07-04 NOTE — Assessment & Plan Note (Signed)
Symptoms consistent with allergies Continue Flonase On Singulair for allergies Nasal saline spray PRN

## 2022-07-04 NOTE — Telephone Encounter (Signed)
[facility] does not accept pt  insurance.   They did state that Dr. Ulice Brilliant in [place] may accept pot insurance

## 2022-07-04 NOTE — Assessment & Plan Note (Signed)
On Pantoprazole, uncontrolled Has tried omeprazole as well Switch to Nexium

## 2022-07-21 ENCOUNTER — Ambulatory Visit (HOSPITAL_COMMUNITY)
Admission: RE | Admit: 2022-07-21 | Discharge: 2022-07-21 | Disposition: A | Discharge status: Home / Self Care | Payer: Medicare PPO | Source: Ambulatory Visit | Attending: Internal Medicine | Admitting: Internal Medicine

## 2022-07-21 DIAGNOSIS — Z1231 Encounter for screening mammogram for malignant neoplasm of breast: Secondary | ICD-10-CM | POA: Insufficient documentation

## 2022-07-29 DIAGNOSIS — M1990 Unspecified osteoarthritis, unspecified site: Secondary | ICD-10-CM | POA: Diagnosis not present

## 2022-07-29 DIAGNOSIS — Z79899 Other long term (current) drug therapy: Secondary | ICD-10-CM | POA: Diagnosis not present

## 2022-07-29 DIAGNOSIS — E785 Hyperlipidemia, unspecified: Secondary | ICD-10-CM | POA: Diagnosis not present

## 2022-07-29 DIAGNOSIS — E669 Obesity, unspecified: Secondary | ICD-10-CM | POA: Diagnosis not present

## 2022-07-29 DIAGNOSIS — K219 Gastro-esophageal reflux disease without esophagitis: Secondary | ICD-10-CM | POA: Diagnosis not present

## 2022-07-29 DIAGNOSIS — F411 Generalized anxiety disorder: Secondary | ICD-10-CM | POA: Diagnosis not present

## 2022-07-29 DIAGNOSIS — J302 Other seasonal allergic rhinitis: Secondary | ICD-10-CM | POA: Diagnosis not present

## 2022-07-29 DIAGNOSIS — E1142 Type 2 diabetes mellitus with diabetic polyneuropathy: Secondary | ICD-10-CM | POA: Diagnosis not present

## 2022-07-29 DIAGNOSIS — J4489 Other specified chronic obstructive pulmonary disease: Secondary | ICD-10-CM | POA: Diagnosis not present

## 2022-08-15 ENCOUNTER — Other Ambulatory Visit: Payer: Self-pay | Admitting: Internal Medicine

## 2022-08-15 DIAGNOSIS — F411 Generalized anxiety disorder: Secondary | ICD-10-CM

## 2022-09-18 ENCOUNTER — Encounter: Payer: Self-pay | Admitting: Orthopedic Surgery

## 2022-09-18 ENCOUNTER — Other Ambulatory Visit: Payer: Self-pay | Admitting: Internal Medicine

## 2022-09-18 ENCOUNTER — Ambulatory Visit: Payer: Medicare PPO | Admitting: Orthopedic Surgery

## 2022-09-18 VITALS — BP 151/90 | HR 82 | Ht 59.0 in | Wt 162.0 lb

## 2022-09-18 DIAGNOSIS — Z9889 Other specified postprocedural states: Secondary | ICD-10-CM

## 2022-09-18 NOTE — Progress Notes (Signed)
Chief Complaint  Patient presents with   Shoulder Pain    Follow up right shoulder patient says doing good    Encounter Diagnosis  Name Primary?   S/P right rotator cuff repair 03/25/22 Yes   77 year old female status post rotator cuff repair right shoulder March 25, 2022 she has no pain she has full range of motion she has good strength in her shoulder I released her today follow-up as needed

## 2022-10-07 ENCOUNTER — Ambulatory Visit (INDEPENDENT_AMBULATORY_CARE_PROVIDER_SITE_OTHER): Payer: Medicare PPO

## 2022-10-07 ENCOUNTER — Ambulatory Visit: Payer: Medicare PPO | Admitting: Internal Medicine

## 2022-10-07 DIAGNOSIS — Z0000 Encounter for general adult medical examination without abnormal findings: Secondary | ICD-10-CM

## 2022-10-07 NOTE — Patient Instructions (Signed)

## 2022-10-07 NOTE — Progress Notes (Signed)
Subjective:   DACY FIGURES is a 77 y.o. female who presents for Medicare Annual (Subsequent) preventive examination.  Visit Complete: Virtual  I connected with  Renard Hamper on 10/07/22 by a audio enabled telemedicine application and verified that I am speaking with the correct person using two identifiers.  Patient Location: Home  Provider Location: Office/Clinic  I discussed the limitations of evaluation and management by telemedicine. The patient expressed understanding and agreed to proceed.  Patient Medicare AWV questionnaire was completed by the patient on 10/07/2022; I have confirmed that all information answered by patient is correct and no changes since this date.  Vital Signs: Unable to obtain new vitals due to this being a telehealth visit.   Review of Systems     Ms. Mettler , Thank you for taking time to come for your Medicare Wellness Visit. I appreciate your ongoing commitment to your health goals. Please review the following plan we discussed and let me know if I can assist you in the future.   These are the goals we discussed:  Goals      Increase physical activity     Would like to exercise more and lose more weight        This is a list of the screening recommended for you and due dates:  Health Maintenance  Topic Date Due   DTaP/Tdap/Td vaccine (1 - Tdap) Never done   Zoster (Shingles) Vaccine (1 of 2) Never done   COVID-19 Vaccine (4 - 2023-24 season) 01/30/2022   Eye exam for diabetics  09/06/2022   Flu Shot  10/09/2022   Hemoglobin A1C  12/26/2022   Yearly kidney health urinalysis for diabetes  02/27/2023   Complete foot exam   02/27/2023   Yearly kidney function blood test for diabetes  06/26/2023   Medicare Annual Wellness Visit  10/07/2023   Colon Cancer Screening  02/20/2024   Pneumonia Vaccine  Completed   DEXA scan (bone density measurement)  Completed   Hepatitis C Screening  Completed   HPV Vaccine  Aged Out    Cardiac Risk Factors  include: advanced age (>58men, >16 women);hypertension;obesity (BMI >30kg/m2)     Objective:    There were no vitals filed for this visit. There is no height or weight on file to calculate BMI.     10/07/2022   10:13 AM 03/25/2022   11:22 AM 03/21/2022    1:32 PM 10/04/2021   10:11 AM 10/02/2020   10:19 AM 02/19/2017    7:29 AM  Advanced Directives  Does Patient Have a Medical Advance Directive? No No No No No No  Would patient like information on creating a medical advance directive? No - Patient declined No - Patient declined No - Patient declined No - Patient declined No - Patient declined No - Patient declined    Current Medications (verified) Outpatient Encounter Medications as of 10/07/2022  Medication Sig   albuterol (VENTOLIN HFA) 108 (90 Base) MCG/ACT inhaler Inhale 2 puffs into the lungs every 6 (six) hours as needed for wheezing or shortness of breath.   ALPRAZolam (XANAX) 0.5 MG tablet TAKE 1 TABLET BY MOUTH TWICE DAILY AS NEEDED FOR ANXIETY   amLODipine (NORVASC) 10 MG tablet TAKE 1 TABLET BY MOUTH EVERY DAY   aspirin EC 81 MG tablet Take 81 mg by mouth daily.   atorvastatin (LIPITOR) 20 MG tablet Take 1 tablet (20 mg total) by mouth daily.   blood glucose meter kit and supplies KIT Dispense based  on patient and insurance preference. Use up to four times daily as directed.   BREZTRI AEROSPHERE 160-9-4.8 MCG/ACT AERO INHALE TWO PUFFS BY MOUTH TWICE DAILY   cholecalciferol (VITAMIN D3) 25 MCG (1000 UNIT) tablet Take 1,000 Units by mouth daily.   esomeprazole (NEXIUM) 40 MG capsule Take 1 capsule (40 mg total) by mouth daily.   fluticasone (FLONASE) 50 MCG/ACT nasal spray Place 2 sprays into both nostrils daily. (Patient taking differently: Place 2 sprays into both nostrils daily as needed for allergies.)   hydrochlorothiazide (MICROZIDE) 12.5 MG capsule TAKE ONE CAPSULE BY MOUTH EVERY DAY   metFORMIN (GLUCOPHAGE-XR) 500 MG 24 hr tablet TAKE 1 TABLET BY MOUTH DAILY WITH  BREAKFAST   montelukast (SINGULAIR) 10 MG tablet TAKE 1 TABLET BY MOUTH AT BEDTIME   polyvinyl alcohol (LIQUIFILM TEARS) 1.4 % ophthalmic solution Place 1 drop into both eyes as needed for dry eyes.   tacrolimus (PROTOPIC) 0.1 % ointment Apply topically 2 (two) times daily. (Patient taking differently: Apply 1 Application topically daily as needed (rash).)   triamcinolone cream (KENALOG) 0.1 % Apply 1 Application topically 2 (two) times daily. (Patient taking differently: Apply 1 Application topically 2 (two) times daily as needed (rash).)   venlafaxine XR (EFFEXOR-XR) 75 MG 24 hr capsule TAKE ONE CAPSULE BY MOUTH DAILY WITH BREAKFAST   No facility-administered encounter medications on file as of 10/07/2022.    Allergies (verified) Patient has no known allergies.   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   Mixed hyperlipidemia 11/12/2015   Neuromuscular disorder (HCC)    OSA (obstructive sleep apnea) 06/30/2022   Osteoporosis    Stroke (HCC)    Ulcer    Past Surgical History:  Procedure Laterality Date   ABDOMINAL HYSTERECTOMY     COLONOSCOPY N/A 02/19/2017   Procedure: COLONOSCOPY;  Surgeon: Malissa Hippo, MD;  Location: [facility];  Service: Endoscopy;  Laterality: N/A;  830   EYE SURGERY     FRACTURE SURGERY     SHOULDER ARTHROSCOPY WITH OPEN ROTATOR CUFF REPAIR Right 03/25/2022   Procedure: SHOULDER ARTHROSCOPY WITH MINI OPEN ROTATOR CUFF REPAIR;  Surgeon: Vickki Hearing, MD;  Location: [facility];  Service: Orthopedics;  Laterality: Right;   TUBAL LIGATION     Family History  Problem Relation Age  of Onset   Breast cancer Maternal Aunt    Social History   Socioeconomic History   Marital status: Single    Spouse name: Not on file   Number of children: Not on file   Years of education: Not on file   Highest education level: Not on file  Occupational History   Not on file  Tobacco Use   Smoking status: Former    Types: Cigars   Smokeless tobacco: Never  Substance and Sexual Activity   Alcohol use: Yes    Alcohol/week: 1.0 standard drink of alcohol    Types: 1 Cans of beer per week    Comment: some days   Drug use: Yes    Types: Marijuana    Comment: here and there for pain   Sexual activity: Not  on file  Other Topics Concern   Not on file  Social History Narrative   Not on file   Social Determinants of Health   Financial Resource Strain: Low Risk  (10/07/2022)   Overall Financial Resource Strain (CARDIA)    Difficulty of Paying Living Expenses: Not hard at all  Food Insecurity: No Food Insecurity (10/07/2022)   Hunger Vital Sign    Worried About Running Out of Food in the Last Year: Never true    Ran Out of Food in the Last Year: Never true  Transportation Needs: No Transportation Needs (10/07/2022)   PRAPARE - Administrator, Civil Service (Medical): No    Lack of Transportation (Non-Medical): No  Physical Activity: Sufficiently Active (10/07/2022)   Exercise Vital Sign    Days of Exercise per Week: 7 days    Minutes of Exercise per Session: 60 min  Stress: No Stress Concern Present (10/07/2022)   [employer] of Occupational Health - Occupational Stress Questionnaire    Feeling of Stress : Not at all  Social Connections: Moderately Isolated (10/07/2022)   Social Connection and Isolation Panel [NHANES]    Frequency of Communication with Friends and Family: More than three times a week    Frequency of Social Gatherings with Friends and Family: Three times a week    Attends Religious Services: More than 4 times per year    Active Member of Clubs or  Organizations: No    Attends Banker Meetings: Never    Marital Status: Widowed    Tobacco Counseling Counseling given: Not Answered   Clinical Intake:     Pain : No/denies pain     BMI - recorded: 32.7 Nutritional Status: BMI > 30  Obese Diabetes: Yes CBG done?: No Did pt. bring in CBG monitor from home?: No  How often do you need to have someone help you when you read instructions, pamphlets, or other written materials from your doctor or pharmacy?: 1 - Never What is the last grade level you completed in school?: GED         Activities of Daily Living    10/07/2022   10:10 AM 10/03/2022    9:06 AM  In your present state of health, do you have any difficulty performing the following activities:  Hearing? 0 0  Vision? 0 0  Difficulty concentrating or making decisions? 1 1  Walking or climbing stairs? 0 0  Dressing or bathing? 0 0  Doing errands, shopping? 0 0  Preparing Food and eating ? N N  Using the Toilet? N N  In the past six months, have you accidently leaked urine? Y Y  Do you have problems with loss of bowel control? Y N  Managing your Medications? N N  Managing your Finances? N N  Housekeeping or managing your Housekeeping? N N    Patient Care Team: Anabel Halon, MD as PCP - General (Internal Medicine)  Indicate any recent Medical Services you may have received from other than [facility] providers in the past year (date may be approximate).     Assessment:   This is a routine wellness examination for Shruthika.  Hearing/Vision screen No results found.  Dietary issues and exercise activities discussed:     Goals Addressed             This Visit's Progress    Increase physical activity   On track    Would like to exercise more and lose more weight  Depression Screen    10/07/2022   10:14 AM 10/07/2022   10:13 AM 06/30/2022    8:13 AM 02/26/2022   10:34 AM 01/13/2022    9:41 AM 11/19/2021    8:41 AM 10/04/2021   10:11 AM   PHQ 2/9 Scores  PHQ - 2 Score 1 1 0 2 1 0 0  PHQ- 9 Score   9 7       Fall Risk    10/07/2022   10:13 AM 10/03/2022    9:06 AM 06/30/2022    8:13 AM 02/26/2022   10:34 AM 01/13/2022    9:41 AM  Fall Risk   Falls in the past year? 0 0 1 1 0  Number falls in past yr: 0 0 0 0 0  Injury with Fall? 0 1 1 1  0  Risk for fall due to : No Fall Risks  Impaired balance/gait;Impaired mobility  No Fall Risks  Follow up Falls evaluation completed  Falls evaluation completed  Falls evaluation completed    MEDICARE RISK AT HOME:   TIMED UP AND GO:  Was the test performed?  No    Cognitive Function:    10/02/2020   10:20 AM  MMSE - Mini Mental State Exam  Not completed: Unable to complete        10/07/2022   10:14 AM 10/04/2021   10:12 AM 10/02/2020   10:20 AM  6CIT Screen  What Year? 0 points 0 points 0 points  What month? 0 points 0 points 0 points  What time? 0 points 0 points 0 points  Count back from 20 0 points 0 points 0 points  Months in reverse 0 points 0 points 0 points  Repeat phrase 0 points 0 points 0 points  Total Score 0 points 0 points 0 points    Immunizations Immunization History  Administered Date(s) Administered   Fluad Quad(high Dose 65+) 12/05/2021   Influenza-Unspecified 11/08/2013, 01/22/2021   Moderna Covid-19 Vaccine Bivalent Booster 25yrs & up 12/05/2021   Moderna SARS-COV2 Booster Vaccination 02/14/2020, 01/22/2021   Moderna Sars-Covid-2 Vaccination 05/10/2019, 06/07/2019   PNEUMOCOCCAL CONJUGATE-20 09/12/2021    TDAP status: Up to date  Flu Vaccine status: Up to date  Pneumococcal vaccine status: Up to date  Covid-19 vaccine status: Completed vaccines  Qualifies for Shingles Vaccine? Yes   Zostavax completed Yes   Shingrix Completed?: No.    Education has been provided regarding the importance of this vaccine. Patient has been advised to call insurance company to determine out of pocket expense if they have not yet received this vaccine.  Advised may also receive vaccine at local pharmacy or Health Dept. Verbalized acceptance and understanding.  Screening Tests Health Maintenance  Topic Date Due   DTaP/Tdap/Td (1 - Tdap) Never done   Zoster Vaccines- Shingrix (1 of 2) Never done   COVID-19 Vaccine (4 - 2023-24 season) 01/30/2022   OPHTHALMOLOGY EXAM  09/06/2022   INFLUENZA VACCINE  10/09/2022   HEMOGLOBIN A1C  12/26/2022   Diabetic kidney evaluation - Urine ACR  02/27/2023   FOOT EXAM  02/27/2023   Diabetic kidney evaluation - eGFR measurement  06/26/2023   Medicare Annual Wellness (AWV)  10/07/2023   Colonoscopy  02/20/2024   Pneumonia Vaccine 49+ Years old  Completed   DEXA SCAN  Completed   Hepatitis C Screening  Completed   HPV VACCINES  Aged Out    Health Maintenance  Health Maintenance Due  Topic Date Due   DTaP/Tdap/Td (1 - Tdap)  Never done   Zoster Vaccines- Shingrix (1 of 2) Never done   COVID-19 Vaccine (4 - 2023-24 season) 01/30/2022   OPHTHALMOLOGY EXAM  09/06/2022    Colorectal cancer screening: Type of screening: Colonoscopy. Completed 02/19/2017. Repeat every 7 years  Mammogram status: No longer required due to age.  Bone Density status: Completed 12/06/2020. Results reflect: Bone density results: OSTEOPOROSIS. Repeat every 2 years.  Lung Cancer Screening: (Low Dose CT Chest recommended if Age 35-80 years, 20 pack-year currently smoking OR have quit w/in 15years.) does qualify.   Lung Cancer Screening Referral:   Additional Screening:  Hepatitis C Screening: does qualify; Completed 02/08/2021  Vision Screening: Recommended annual ophthalmology exams for early detection of glaucoma and other disorders of the eye. Is the patient up to date with their annual eye exam?  Yes  Who is the provider or what is the name of the office in which the patient attends annual eye exams? [facility] If pt is not established with a provider, would they like to be referred to a provider to establish care? No .    Dental Screening: Recommended annual dental exams for proper oral hygiene  Diabetic Foot Exam: Diabetic Foot Exam: Completed 02/26/2022  Community Resource Referral / Chronic Care Management: CRR required this visit?  No   CCM required this visit?  No     Plan:     I have personally reviewed and noted the following in the patient's chart:   Medical and social history Use of alcohol, tobacco or illicit drugs  Current medications and supplements including opioid prescriptions. Patient is currently taking opioid prescriptions. Information provided to patient regarding non-opioid alternatives. Patient advised to discuss non-opioid treatment plan with their provider. Functional ability and status Nutritional status Physical activity Advanced directives List of other physicians Hospitalizations, surgeries, and ER visits in previous 12 months Vitals Screenings to include cognitive, depression, and falls Referrals and appointments  In addition, I have reviewed and discussed with patient certain preventive protocols, quality metrics, and best practice recommendations. A written personalized care plan for preventive services as well as general preventive health recommendations were provided to patient.     Telford Nab, CMA   10/07/2022   After Visit Summary: (Mail) Due to this being a telephonic visit, the after visit summary with patients personalized plan was offered to patient via mail   Nurse Notes:  Ms. Baron , Thank you for taking time to come for your Medicare Wellness Visit. I appreciate your ongoing commitment to your health goals. Please review the following plan we discussed and let me know if I can assist you in the future.   These are the goals we discussed:  Goals      Increase physical activity     Would like to exercise more and lose more weight        This is a list of the screening recommended for you and due dates:  Health Maintenance  Topic Date  Due   DTaP/Tdap/Td vaccine (1 - Tdap) Never done   Zoster (Shingles) Vaccine (1 of 2) Never done   COVID-19 Vaccine (4 - 2023-24 season) 01/30/2022   Eye exam for diabetics  09/06/2022   Flu Shot  10/09/2022   Hemoglobin A1C  12/26/2022   Yearly kidney health urinalysis for diabetes  02/27/2023   Complete foot exam   02/27/2023   Yearly kidney function blood test for diabetes  06/26/2023   Medicare Annual Wellness Visit  10/07/2023   Colon Cancer  Screening  02/20/2024   Pneumonia Vaccine  Completed   DEXA scan (bone density measurement)  Completed   Hepatitis C Screening  Completed   HPV Vaccine  Aged Out

## 2022-10-13 NOTE — Progress Notes (Unsigned)
Robin Bridges, female    DOB: Nov 01, 1945  MRN: [id number]   Brief patient profile:  47 yobf  quit smoking Aug 2022 with onset of cough > sob at death of husband Jul 31, 2018 referred to pulmonary clinic in [place]  06/27/2021 by Dr Allena Katz for   ? Copd with 20 lb wt gain since stopped with completely reversible airflow obst 06/2021 PFT    History of Present Illness  06/27/2021  Pulmonary/ 1st office eval/  / [facility]  Chief Complaint  Patient presents with   Consult    Ref by Dr. Allena Katz for ongoing bronchitis/cough/sob  Patient believes she could have asthma. She states the pollen and strong perfume smells set off her coughing and SOB   Dyspnea:  walk  better p rx  breztri / still  doe vacuuming / shopping is  ok  Cough: typically p supper  but not really taking breztri 2 q 12 and seems better p pm breztri when remembers to take it / sputum mucoid  Sleep: ok hs  SABA use: twice weekly Rec Plan A = Automatic = Always=  continue  Breztri Take 2 puffs first thing in am and then another 2 puffs about 12 hours later.   Work on inhaler technique:   -  remember how a golfer takes practice swings  Plan B = Backup (to supplement plan A, not to replace it) Only use your albuterol inhaler as a rescue medication   - PFT's  07/01/21   FEV1 1.44 (83 % ) ratio 0.81  p 21 % improvement from saba p breztri prior to study with DLCO  Nl  And FV curve concave and ERV 16%  at wt 155     10/07/2021  f/u ov/[place] office/ re: AB maint on breztri 2bid   Chief Complaint  Patient presents with   Follow-up    Breathing is about the same since last ov. Heat and humidity bothering patient.   Dyspnea:  vacuuming still req albuterol to get thru Cough: none  Sleeping:  flat bed/ 3 pillows  SABA use: maybe once a day, none at hs  02: none  Covid status: vax x 4  Lung cancer screening: CT chest  05/14/21  Rec Work on inhaler technique:   Only use your albuterol as a rescue medication  Ok to  try albuterol 15 min before an activity (on alternating days)  that you know would usually make you short of breath      10/14/2022  yearly  f/u ov/[place] office/ re: AB maint on breztri 2bid   Chief Complaint  Patient presents with   Asthmatic bronchitis, chronic   Dyspnea:  improved  vaccuuming easier/ some track walking 30 min  Cough: at hs better p albuterol no noct saba  Sleeping: flat bed/ 2pillows on head  SABA use: at hs and in heat  02: none  Fleeing cp when stretches arms ((ext rotation) shoots from L cw to R side "like a cramp" x sev min never with cough or exertion Lung cancer screening: referred today    No obvious day to day or daytime variability or assoc excess/ purulent sputum or mucus plugs or hemoptysis or   chest tightness, subjective wheeze or overt sinus or hb symptoms.   sleeping without nocturnal  or early am exacerbation  of respiratory  c/o's or need for noct saba. Also denies any obvious fluctuation of symptoms with weather or environmental changes or other aggravating or alleviating factors  except as outlined above   No unusual exposure hx or h/o childhood pna/ asthma or knowledge of premature birth.  Current Allergies, Complete Past Medical History, Past Surgical History, Family History, and Social History were reviewed in [employer] record.  ROS  The following are not active complaints unless bolded Hoarseness, sore throat, dysphagia, dental problems, itching, sneezing,  nasal congestion or discharge of excess mucus or purulent secretions, ear ache,   fever, chills, sweats, unintended wt loss or wt gain, classically pleuritic or exertional cp,  orthopnea pnd or arm/hand swelling  or leg swelling, presyncope, palpitations, abdominal pain, anorexia, nausea, vomiting, diarrhea  or change in bowel habits or change in bladder habits, change in stools or change in urine, dysuria, hematuria,  rash, arthralgias, visual complaints, headache,  numbness, weakness or ataxia or problems with walking or coordination,  change in mood or  memory.        Current Meds  Medication Sig   albuterol (VENTOLIN HFA) 108 (90 Base) MCG/ACT inhaler Inhale 2 puffs into the lungs every 6 (six) hours as needed for wheezing or shortness of breath.   ALPRAZolam (XANAX) 0.5 MG tablet TAKE 1 TABLET BY MOUTH TWICE DAILY AS NEEDED FOR ANXIETY   amLODipine (NORVASC) 10 MG tablet TAKE 1 TABLET BY MOUTH EVERY DAY   aspirin EC 81 MG tablet Take 81 mg by mouth daily.   atorvastatin (LIPITOR) 20 MG tablet Take 1 tablet (20 mg total) by mouth daily.   blood glucose meter kit and supplies KIT Dispense based on patient and insurance preference. Use up to four times daily as directed.   BREZTRI AEROSPHERE 160-9-4.8 MCG/ACT AERO INHALE TWO PUFFS BY MOUTH TWICE DAILY   cholecalciferol (VITAMIN D3) 25 MCG (1000 UNIT) tablet Take 1,000 Units by mouth daily.   esomeprazole (NEXIUM) 40 MG capsule Take 1 capsule (40 mg total) by mouth daily.   fluticasone (FLONASE) 50 MCG/ACT nasal spray Place 2 sprays into both nostrils daily. (Patient taking differently: Place 2 sprays into both nostrils daily as needed for allergies.)   hydrochlorothiazide (MICROZIDE) 12.5 MG capsule TAKE ONE CAPSULE BY MOUTH EVERY DAY   metFORMIN (GLUCOPHAGE-XR) 500 MG 24 hr tablet TAKE 1 TABLET BY MOUTH DAILY WITH BREAKFAST   montelukast (SINGULAIR) 10 MG tablet TAKE 1 TABLET BY MOUTH AT BEDTIME   polyvinyl alcohol (LIQUIFILM TEARS) 1.4 % ophthalmic solution Place 1 drop into both eyes as needed for dry eyes.   tacrolimus (PROTOPIC) 0.1 % ointment Apply topically 2 (two) times daily. (Patient taking differently: Apply 1 Application topically daily as needed (rash).)   triamcinolone cream (KENALOG) 0.1 % Apply 1 Application topically 2 (two) times daily. (Patient taking differently: Apply 1 Application topically 2 (two) times daily as needed (rash).)   venlafaxine XR (EFFEXOR-XR) 75 MG 24 hr capsule TAKE  ONE CAPSULE BY MOUTH DAILY WITH BREAKFAST                      Past Medical History:  Diagnosis Date   Hypertension         Objective:    wts  10/14/2022         164   10/07/21 158 lb 9.6 oz (71.9 kg)  09/12/21 157 lb 9.6 oz (71.5 kg)  08/06/21 154 lb (69.9 kg)    Vital signs reviewed  10/14/2022  - Note at rest 02 sats  93% on RA   General appearance:    amb pleasant bf nad  HEENT : Oropharynx  clear          NECK :  without  apparent JVD/ palpable Nodes/TM    LUNGS: no acc muscle use,  Nl contour chest which is clear to A and P bilaterally without cough on insp or exp maneuvers   CV:  RRR  no s3 or murmur or increase in P2, and no edema   ABD:  soft and nontender    MS:  Nl gait/ ext warm without deformities Or obvious joint restrictions  calf tenderness, cyanosis or clubbing    SKIN: warm and dry without lesions    NEURO:  alert, approp, nl sensorium with  no motor or cerebellar deficits apparent.           Assessment

## 2022-10-14 ENCOUNTER — Encounter: Payer: Self-pay | Admitting: Internal Medicine

## 2022-10-14 ENCOUNTER — Ambulatory Visit: Payer: Medicare PPO | Admitting: Internal Medicine

## 2022-10-14 VITALS — BP 169/83 | HR 84 | Ht 59.0 in | Wt 164.0 lb

## 2022-10-14 DIAGNOSIS — J4489 Other specified chronic obstructive pulmonary disease: Secondary | ICD-10-CM | POA: Diagnosis not present

## 2022-10-14 DIAGNOSIS — Z87891 Personal history of nicotine dependence: Secondary | ICD-10-CM | POA: Diagnosis not present

## 2022-10-14 NOTE — Patient Instructions (Addendum)
Take your singulair and pepcid 20 mg about an hour before bed should help the night time cough   Work on inhaler technique:  relax and gently blow all the way out then take a nice smooth full deep breath back in, triggering the inhaler at same time you start breathing in.  Hold breath in for at least  5 seconds if you can. Blow out breztri  thru nose. Rinse and gargle with water when done.  If mouth or throat bother you at all,  try brushing teeth/gums/tongue with arm and hammer toothpaste/ make a slurry and gargle and spit out.       Consider bed blocks 6-8 inches   My office will be contacting you by phone for referral to lung cancer screening program   - if you don't hear back from my office within one week please call us back or notify us thru MyChart and we'll address it right away.   Please schedule a follow up visit in 6  months but call sooner if needed

## 2022-10-14 NOTE — Assessment & Plan Note (Signed)
Onset around 2020 p husband's death with 20 lb wt gain  - Stopped smoking 10/2020 at wt 151  - 06/27/2021  After extensive coaching inhaler device,  effectiveness =    50% at best > continue breztri pending pfts  - 06/27/2021   Walked on RA  x  3  lap(s) =  approx 450  ft  @ fast pace, stopped due to end of study, onset  sob on 2nd lap  with lowest 02 sats 95% - PFT's  07/01/21   FEV1 1.44 (83 % ) ratio 0.81  p 21 % improvement from saba p breztri prior to study with DLCO  Nl  And FV curve concave and ERV 16%  at wt 155    10/14/2022  After extensive coaching inhaler device,  effectiveness =    75% (short Ti)     Group D (now reclassified as E) in terms of symptom/risk and laba/lama/ICS  therefore appropriate rx at this point >>>  breztri and approp saba:  Re SABA :  I spent extra time with pt today reviewing appropriate use of albuterol for prn use on exertion with the following points: 1) saba is for relief of sob that does not improve by walking a slower pace or resting but rather if the pt does not improve after trying this first. 2) If the pt is convinced, as many are, that saba helps recover from activity faster then it's easy to tell if this is the case by re-challenging : ie stop, take the inhaler, then p 5 minutes try the exact same activity (intensity of workload) that just caused the symptoms and see if they are substantially diminished or not after saba 3) if there is an activity that reproducibly causes the symptoms, try the saba 15 min before the activity on alternate days   If in fact the saba really does help, then fine to continue to use it prn but advised may need to look closer at the maintenance regimen being used to achieve better control of airways disease with exertion.

## 2022-10-14 NOTE — Assessment & Plan Note (Signed)
Referred for [facility] 10/14/2022   Low-dose CT lung cancer screening is recommended for patients who are 69-75 years of age with a 20+ pack-year history of smoking and who are currently smoking or quit <=15 years ago. No coughing up blood  No unintentional weight loss of > 15 pounds in the last 6 months - pt is eligible for scanning yearly until age 26  or 31 depending on insurance > referred  F/u can be yearly , call sooner prn          Each maintenance medication was reviewed in detail including emphasizing most importantly the difference between maintenance and prns and under what circumstances the prns are to be triggered using an action plan format where appropriate.  Total time for H and P, chart review, counseling, reviewing hfa  device(s) and generating customized AVS unique to this annual  office visit / same day charting = 30 min

## 2022-10-30 DIAGNOSIS — H35463 Secondary vitreoretinal degeneration, bilateral: Secondary | ICD-10-CM | POA: Diagnosis not present

## 2022-10-30 LAB — HM DIABETES EYE EXAM

## 2022-11-03 ENCOUNTER — Encounter: Payer: Self-pay | Admitting: Internal Medicine

## 2022-11-03 ENCOUNTER — Ambulatory Visit: Payer: Medicare PPO | Admitting: Internal Medicine

## 2022-11-03 VITALS — BP 142/88 | HR 79 | Ht 59.0 in | Wt 164.0 lb

## 2022-11-03 DIAGNOSIS — R413 Other amnesia: Secondary | ICD-10-CM | POA: Diagnosis not present

## 2022-11-03 DIAGNOSIS — Z7984 Long term (current) use of oral hypoglycemic drugs: Secondary | ICD-10-CM

## 2022-11-03 DIAGNOSIS — E1169 Type 2 diabetes mellitus with other specified complication: Secondary | ICD-10-CM | POA: Diagnosis not present

## 2022-11-03 DIAGNOSIS — J449 Chronic obstructive pulmonary disease, unspecified: Secondary | ICD-10-CM

## 2022-11-03 DIAGNOSIS — E782 Mixed hyperlipidemia: Secondary | ICD-10-CM

## 2022-11-03 DIAGNOSIS — Z23 Encounter for immunization: Secondary | ICD-10-CM | POA: Diagnosis not present

## 2022-11-03 DIAGNOSIS — I10 Essential (primary) hypertension: Secondary | ICD-10-CM

## 2022-11-03 DIAGNOSIS — F330 Major depressive disorder, recurrent, mild: Secondary | ICD-10-CM | POA: Diagnosis not present

## 2022-11-03 MED ORDER — LOSARTAN POTASSIUM 25 MG PO TABS: 25.0000 mg | ORAL_TABLET | Freq: Every day | ORAL | 1 refills | Status: DC

## 2022-11-03 MED ORDER — OZEMPIC (0.25 OR 0.5 MG/DOSE) 2 MG/3ML ~~LOC~~ SOPN: PEN_INJECTOR | SUBCUTANEOUS | 1 refills | Status: DC

## 2022-11-03 NOTE — Assessment & Plan Note (Signed)
BP Readings from Last 1 Encounters:  11/03/22 138/88   Uncontrolled with amlodipine 10 mg QD and hydrochlorothiazide 12.5 mg QD Added losartan 25 mg QD Counseled for compliance with the medications Advised DASH diet and moderate exercise/walking, at least 150 mins/week

## 2022-11-03 NOTE — Progress Notes (Unsigned)
Established Patient Office Visit  Subjective:  Patient ID: Robin Bridges, female    DOB: [birth year]  Age: 77 y.o. MRN: [id number]  CC:  Chief Complaint  Patient presents with   Memory Loss    Short term memory loss    Diabetes    Four month follow up , she feels the metformin drains her and takes her energy away , also makes her be in the bathroom more frequently    HPI Robin Bridges is a 77 y.o. female with past medical history of HTN, type 2 DM, HLD, COPD, GERD, anxiety and tobacco abuse who presents for f/u of her chronic medical conditions.  GAD: She has been taking Effexor and as needed Xanax.  Denies any anhedonia, SI or HI currently.  BP is well-controlled. Takes medications regularly. Patient denies headache, dizziness, chest pain, dyspnea or palpitations.   Type 2 DM: Her HbA1c was 7.0 in 04/24. She has been taking Metformin 500 mg QD, but has diarrhea and feels fatigued. She denies polyuria and polyphagia.  She is concerned about her short-term memory.  Her grandchildren mention that she tells them same things multiple times.  Her MoCA was 27/30.  She is independent with her ADLs and IADLs currently.  Denies any episodes of confusion recently.    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/13/2022   Mixed hyperlipidemia 11/12/2015   Neuromuscular disorder (HCC)    OSA (obstructive sleep apnea) 06/30/2022   Osteoporosis    Stroke (HCC)    Ulcer     Past Surgical History:  Procedure Laterality Date   ABDOMINAL HYSTERECTOMY     COLONOSCOPY N/A 02/19/2017   Procedure: COLONOSCOPY;  Surgeon: Malissa Hippo, MD;   Location: [facility];  Service: Endoscopy;  Laterality: N/A;  830   EYE SURGERY     FRACTURE SURGERY     SHOULDER ARTHROSCOPY WITH OPEN ROTATOR CUFF REPAIR Right 03/25/2022   Procedure: SHOULDER ARTHROSCOPY WITH MINI OPEN ROTATOR CUFF REPAIR;  Surgeon: Vickki Hearing, MD;  Location: [facility];  Service: Orthopedics;  Laterality: Right;   TUBAL LIGATION      Family History  Problem Relation Age of Onset   Breast cancer Maternal Aunt     Social History   Socioeconomic History   Marital status: Single    Spouse name: Not on file   Number of children: Not on file   Years of education: Not on file   Highest education level: GED or equivalent  Occupational History   Not on file  Tobacco Use   Smoking status: Former    Types: Cigars   Smokeless tobacco: Never  Substance and Sexual Activity   Alcohol use: Yes    Alcohol/week: 1.0 standard drink of alcohol    Types: 1 Cans of beer per week    Comment: some days   Drug use: Yes    Types: Marijuana    Comment: here and there for pain   Sexual activity: Not on file  Other Topics Concern   Not on file  Social History Narrative   Not on file  Social Determinants of Health   Financial Resource Strain: Low Risk  (10/30/2022)   Overall Financial Resource Strain (CARDIA)    Difficulty of Paying Living Expenses: Not hard at all  Food Insecurity: No Food Insecurity (10/30/2022)   Hunger Vital Sign    Worried About Running Out of Food in the Last Year: Never true    Ran Out of Food in the Last Year: Never true  Transportation Needs: No Transportation Needs (10/30/2022)   PRAPARE - Administrator, Civil Service (Medical): No    Lack of Transportation (Non-Medical): No  Physical Activity: Insufficiently Active (10/30/2022)   Exercise Vital Sign    Days of Exercise per Week: 3 days    Minutes of Exercise per Session: 30 min  Stress: No Stress Concern Present (10/30/2022)   [employer] of Occupational Health -  Occupational Stress Questionnaire    Feeling of Stress : Not at all  Social Connections: Moderately Integrated (10/30/2022)   Social Connection and Isolation Panel [NHANES]    Frequency of Communication with Friends and Family: More than three times a week    Frequency of Social Gatherings with Friends and Family: More than three times a week    Attends Religious Services: More than 4 times per year    Active Member of [employer] or Organizations: Yes    Attends Banker Meetings: More than 4 times per year    Marital Status: Widowed  Recent Concern: Social Connections - Moderately Isolated (10/07/2022)   Social Connection and Isolation Panel [NHANES]    Frequency of Communication with Friends and Family: More than three times a week    Frequency of Social Gatherings with Friends and Family: Three times a week    Attends Religious Services: More than 4 times per year    Active Member of Clubs or Organizations: No    Attends Banker Meetings: Never    Marital Status: Widowed  Intimate Partner Violence: Not At Risk (10/07/2022)   Humiliation, Afraid, Rape, and Kick questionnaire    Fear of Current or Ex-Partner: No    Emotionally Abused: No    Physically Abused: No    Sexually Abused: No    Outpatient Medications Prior to Visit  Medication Sig Dispense Refill   hydrochlorothiazide (MICROZIDE) 12.5 MG capsule Take 12.5 mg by mouth daily.     albuterol (VENTOLIN HFA) 108 (90 Base) MCG/ACT inhaler Inhale 2 puffs into the lungs every 6 (six) hours as needed for wheezing or shortness of breath. 8 g 4   ALPRAZolam (XANAX) 0.5 MG tablet TAKE 1 TABLET BY MOUTH TWICE DAILY AS NEEDED FOR ANXIETY 60 tablet 4   amLODipine (NORVASC) 10 MG tablet TAKE 1 TABLET BY MOUTH EVERY DAY 90 tablet 3   aspirin EC 81 MG tablet Take 81 mg by mouth daily.     atorvastatin (LIPITOR) 20 MG tablet Take 1 tablet (20 mg total) by mouth daily. 90 tablet 1   blood glucose meter kit and supplies KIT  Dispense based on patient and insurance preference. Use up to four times daily as directed. 1 each 0   BREZTRI AEROSPHERE 160-9-4.8 MCG/ACT AERO INHALE TWO PUFFS BY MOUTH TWICE DAILY 10.7 g 11   cholecalciferol (VITAMIN D3) 25 MCG (1000 UNIT) tablet Take 1,000 Units by mouth daily.     esomeprazole (NEXIUM) 40 MG capsule Take 1 capsule (40 mg total) by mouth daily. 90 capsule 3   fluticasone (FLONASE) 50 MCG/ACT nasal spray Place 2  sprays into both nostrils daily. (Patient taking differently: Place 2 sprays into both nostrils daily as needed for allergies.) 16 g 6   montelukast (SINGULAIR) 10 MG tablet TAKE 1 TABLET BY MOUTH AT BEDTIME 90 tablet 3   polyvinyl alcohol (LIQUIFILM TEARS) 1.4 % ophthalmic solution Place 1 drop into both eyes as needed for dry eyes.     tacrolimus (PROTOPIC) 0.1 % ointment Apply topically 2 (two) times daily. (Patient taking differently: Apply 1 Application topically daily as needed (rash).) 100 g 0   triamcinolone cream (KENALOG) 0.1 % Apply 1 Application topically 2 (two) times daily. (Patient taking differently: Apply 1 Application topically 2 (two) times daily as needed (rash).) 30 g 0   venlafaxine XR (EFFEXOR-XR) 75 MG 24 hr capsule TAKE ONE CAPSULE BY MOUTH DAILY WITH BREAKFAST 90 capsule 1   hydrochlorothiazide (MICROZIDE) 12.5 MG capsule TAKE ONE CAPSULE BY MOUTH EVERY DAY 90 capsule 1   metFORMIN (GLUCOPHAGE-XR) 500 MG 24 hr tablet TAKE 1 TABLET BY MOUTH DAILY WITH BREAKFAST 90 tablet 2   No facility-administered medications prior to visit.    No Known Allergies  ROS Review of Systems  Constitutional:  Negative for chills and fever.  HENT:  Negative for congestion, postnasal drip, sinus pain and sore throat.   Eyes:  Negative for pain and discharge.  Respiratory:  Negative for cough, shortness of breath and wheezing.   Cardiovascular:  Negative for chest pain and palpitations.  Gastrointestinal:  Negative for abdominal pain, diarrhea, nausea and  vomiting.  Endocrine: Negative for polydipsia and polyuria.  Genitourinary:  Negative for dysuria and hematuria.  Musculoskeletal:  Negative for neck pain and neck stiffness.  Skin:  Negative for rash.  Neurological:  Negative for dizziness and weakness.  Psychiatric/Behavioral:  Negative for agitation and behavioral problems.       Objective:    Physical Exam Vitals reviewed.  Constitutional:      General: She is not in acute distress.    Appearance: She is not diaphoretic.  HENT:     Head: Normocephalic and atraumatic.     Nose: No congestion.     Mouth/Throat:     Mouth: Mucous membranes are moist.  Eyes:     General: No scleral icterus.    Extraocular Movements: Extraocular movements intact.  Cardiovascular:     Rate and Rhythm: Normal rate and regular rhythm.     Pulses: Normal pulses.     Heart sounds: Normal heart sounds. No murmur heard. Pulmonary:     Breath sounds: Normal breath sounds. No wheezing or rales.  Musculoskeletal:     Cervical back: Neck supple. No tenderness.     Right lower leg: No edema.     Left lower leg: No edema.  Feet:     Right foot:     Toenail Condition: Right toenails are abnormally thick and ingrown.     Left foot:     Toenail Condition: Left toenails are abnormally thick and ingrown.  Skin:    General: Skin is warm.     Findings: No rash.     Comments: Skin tag over neck region  Neurological:     General: No focal deficit present.     Mental Status: She is alert and oriented to person, place, and time.     Sensory: No sensory deficit.     Motor: No weakness.  Psychiatric:        Mood and Affect: Mood normal.  Behavior: Behavior normal.     BP (!) 142/88 (BP Location: Left Arm)   Pulse 79   Ht 4\' 11"  (1.499 m)   Wt 164 lb (74.4 kg)   SpO2 95%   BMI 33.12 kg/m  Wt Readings from Last 3 Encounters:  11/03/22 164 lb (74.4 kg)  10/14/22 164 lb (74.4 kg)  09/18/22 162 lb (73.5 kg)    No results found for:  "TSH" Lab Results  Component Value Date   WBC 8.6 03/21/2022   HGB 14.1 03/21/2022   HCT 43.1 03/21/2022   MCV 87.8 03/21/2022   PLT 304 03/21/2022   Lab Results  Component Value Date   NA 143 06/26/2022   K 4.5 06/26/2022   CO2 23 06/26/2022   GLUCOSE 111 (H) 06/26/2022   BUN 19 06/26/2022   CREATININE 0.82 06/26/2022   BILITOT 0.3 06/26/2022   ALKPHOS 54 06/26/2022   AST 27 06/26/2022   ALT 27 06/26/2022   PROT 6.9 06/26/2022   ALBUMIN 4.7 06/26/2022   CALCIUM 9.3 06/26/2022   ANIONGAP 12 03/21/2022   EGFR 75 06/26/2022   Lab Results  Component Value Date   CHOL 192 06/26/2022   Lab Results  Component Value Date   HDL 60 06/26/2022   Lab Results  Component Value Date   LDLCALC 105 (H) 06/26/2022   Lab Results  Component Value Date   TRIG 153 (H) 06/26/2022   Lab Results  Component Value Date   CHOLHDL 3.2 06/26/2022   Lab Results  Component Value Date   HGBA1C 7.0 (H) 06/26/2022      Assessment & Plan:   Problem List Items Addressed This Visit       Cardiovascular and Mediastinum   Benign essential hypertension - Primary (Chronic)    BP Readings from Last 1 Encounters:  11/03/22 138/88   Uncontrolled with amlodipine 10 mg QD and hydrochlorothiazide 12.5 mg QD Added losartan 25 mg QD Counseled for compliance with the medications Advised DASH diet and moderate exercise/walking, at least 150 mins/week      Relevant Medications   hydrochlorothiazide (MICROZIDE) 12.5 MG capsule   losartan (COZAAR) 25 MG tablet     Respiratory   Chronic obstructive pulmonary disease (COPD) (HCC) (Chronic)    Overall has been well controlled with Breztri and as needed albuterol        Endocrine   Diabetes mellitus (HCC) (Chronic)    Lab Results  Component Value Date   HGBA1C 7.0 (H) 06/26/2022   Overall well controlled Associated with HTN and HLD DC Metformin as she has diarrhea Started Ozempic-plan to increase dose as tolerated Advised to follow  diabetic diet On statin F/u CMP and lipid panel Diabetic eye exam: Advised to follow up with Ophthalmology for diabetic eye exam      Relevant Medications   Semaglutide,0.25 or 0.5MG /DOS, (OZEMPIC, 0.25 OR 0.5 MG/DOSE,) 2 MG/3ML SOPN   losartan (COZAAR) 25 MG tablet   Other Relevant Orders   Basic Metabolic Panel (BMET)   Hemoglobin A1c     Other   Mixed hyperlipidemia    Lipid profile reviewed On Atorvastatin 20 mg ONCE DAILY now      Relevant Medications   hydrochlorothiazide (MICROZIDE) 12.5 MG capsule   losartan (COZAAR) 25 MG tablet   Mild episode of recurrent major depressive disorder (HCC)    Well-controlled with Effexor to 75 mg daily      Memory problem    She had concern for short-term memory loss MoCA:  27/30 Reassured that she may have age related cognitive decline later, but appears normal currently Independent for ADLs and iADLs      Other Visit Diagnoses     Encounter for immunization       Relevant Orders   Flu Vaccine Trivalent High Dose (Fluad) (Completed)       Meds ordered this encounter  Medications   Semaglutide,0.25 or 0.5MG /DOS, (OZEMPIC, 0.25 OR 0.5 MG/DOSE,) 2 MG/3ML SOPN    Sig: Inject 0.25 mg into the skin every 7 (seven) days for 28 days, THEN 0.5 mg every 7 (seven) days.    Dispense:  3 mL    Refill:  1   losartan (COZAAR) 25 MG tablet    Sig: Take 1 tablet (25 mg total) by mouth daily.    Dispense:  30 tablet    Refill:  1    Follow-up: Return in about 4 months (around 03/05/2023) for HTN.    Anabel Halon, MD

## 2022-11-03 NOTE — Patient Instructions (Addendum)
Please stop taking Metformin. Please start taking Ozempic 0.25 mg once weekly for 4 weeks and then 0.50 mg once weekly.  Please start taking Losartan for blood pressure in addition to Amlodipine and hydrochlorothiazide.  Please continue to take other medications as prescribed.  Please continue to follow low carb diet and perform moderate exercise/walking at least 150 mins/week.  Please get blood tests done after 2 weeks.

## 2022-11-03 NOTE — Assessment & Plan Note (Signed)
Lab Results  Component Value Date   HGBA1C 7.0 (H) 06/26/2022   Overall well controlled Associated with HTN and HLD DC Metformin as she has diarrhea Started Ozempic-plan to increase dose as tolerated Advised to follow diabetic diet On statin F/u CMP and lipid panel Diabetic eye exam: Advised to follow up with Ophthalmology for diabetic eye exam

## 2022-11-03 NOTE — Assessment & Plan Note (Signed)
Overall has been well controlled with Breztri and as needed albuterol

## 2022-11-04 ENCOUNTER — Encounter: Payer: Self-pay | Admitting: Internal Medicine

## 2022-11-04 DIAGNOSIS — R413 Other amnesia: Secondary | ICD-10-CM | POA: Insufficient documentation

## 2022-11-04 NOTE — Assessment & Plan Note (Signed)
Well-controlled with Effexor to 75 mg daily

## 2022-11-04 NOTE — Assessment & Plan Note (Signed)
She had concern for short-term memory loss MoCA: 27/30 Reassured that she may have age related cognitive decline later, but appears normal currently Independent for ADLs and iADLs

## 2022-11-04 NOTE — Assessment & Plan Note (Signed)
Lipid profile reviewed On Atorvastatin 20 mg ONCE DAILY now

## 2022-11-16 ENCOUNTER — Other Ambulatory Visit: Payer: Self-pay | Admitting: Internal Medicine

## 2022-11-17 DIAGNOSIS — E1169 Type 2 diabetes mellitus with other specified complication: Secondary | ICD-10-CM | POA: Diagnosis not present

## 2022-11-18 ENCOUNTER — Other Ambulatory Visit: Payer: Self-pay | Admitting: Internal Medicine

## 2022-11-18 DIAGNOSIS — N179 Acute kidney failure, unspecified: Secondary | ICD-10-CM

## 2022-11-18 DIAGNOSIS — I10 Essential (primary) hypertension: Secondary | ICD-10-CM

## 2022-11-18 LAB — BASIC METABOLIC PANEL
BUN/Creatinine Ratio: 21 (ref 12–28)
BUN: 24 mg/dL (ref 8–27)
CO2: 22 mmol/L (ref 20–29)
Calcium: 9.8 mg/dL (ref 8.7–10.3)
Chloride: 98 mmol/L (ref 96–106)
Creatinine, Ser: 1.13 mg/dL — ABNORMAL HIGH (ref 0.57–1.00)
Glucose: 162 mg/dL — ABNORMAL HIGH (ref 70–99)
Potassium: 4.2 mmol/L (ref 3.5–5.2)
Sodium: 138 mmol/L (ref 134–144)
eGFR: 51 mL/min/{1.73_m2} — ABNORMAL LOW (ref >59)

## 2022-11-18 LAB — HEMOGLOBIN A1C
Est. average glucose Bld gHb Est-mCnc: 154 mg/dL
Hgb A1c MFr Bld: 7 % — ABNORMAL HIGH (ref 4.8–5.6)

## 2022-11-19 DIAGNOSIS — Z20822 Contact with and (suspected) exposure to covid-19: Secondary | ICD-10-CM | POA: Diagnosis not present

## 2022-11-19 DIAGNOSIS — R0602 Shortness of breath: Secondary | ICD-10-CM | POA: Diagnosis not present

## 2022-11-21 DIAGNOSIS — H35463 Secondary vitreoretinal degeneration, bilateral: Secondary | ICD-10-CM | POA: Diagnosis not present

## 2022-11-28 IMAGING — MG MM DIGITAL SCREENING BILAT W/ TOMO AND CAD
8 series · 9 of 24 positions shown · non-contrast
Comparison: Previous exam(s).

CLINICAL DATA: Screening.

EXAM:
DIGITAL SCREENING BILATERAL MAMMOGRAM WITH TOMOSYNTHESIS AND CAD
TECHNIQUE: Bilateral screening digital craniocaudal and mediolateral oblique
mammograms were obtained. Bilateral screening digital breast
tomosynthesis was performed. The images were evaluated with
computer-aided detection.

[L CC synth-2D]
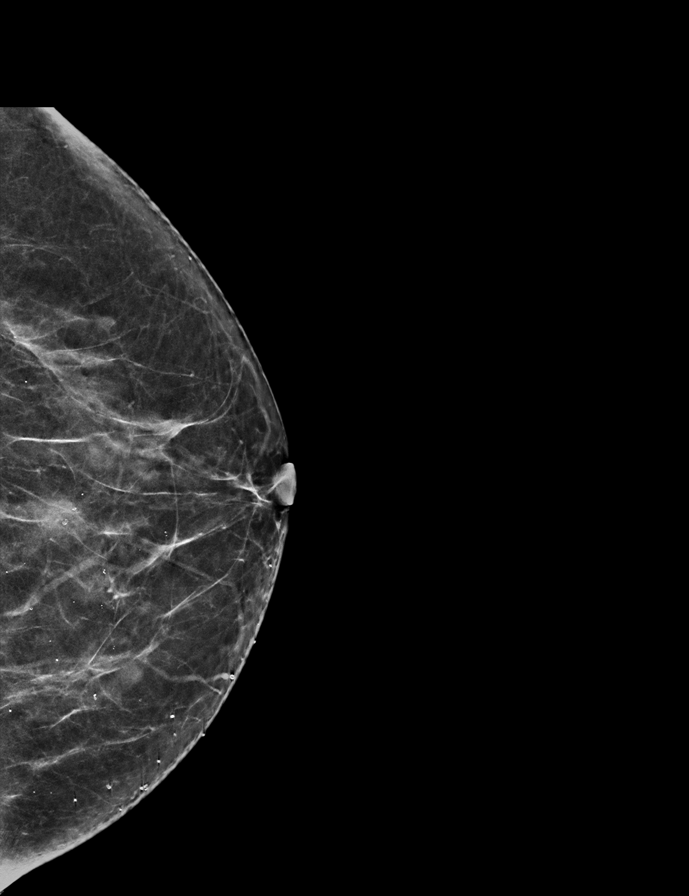

[R CC synth-2D]
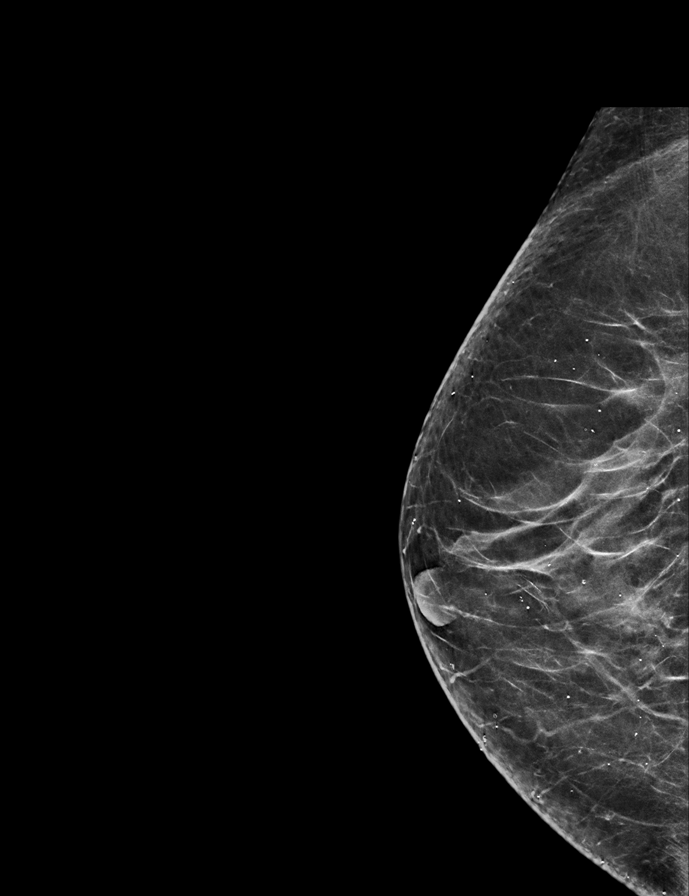

[R MLO synth-2D]
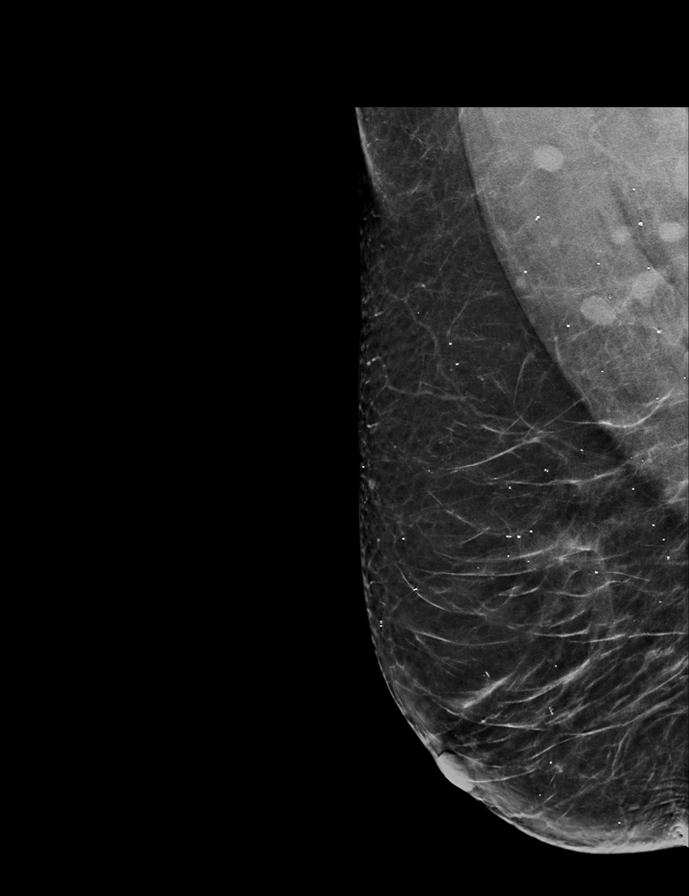

[L MLO synth-2D]
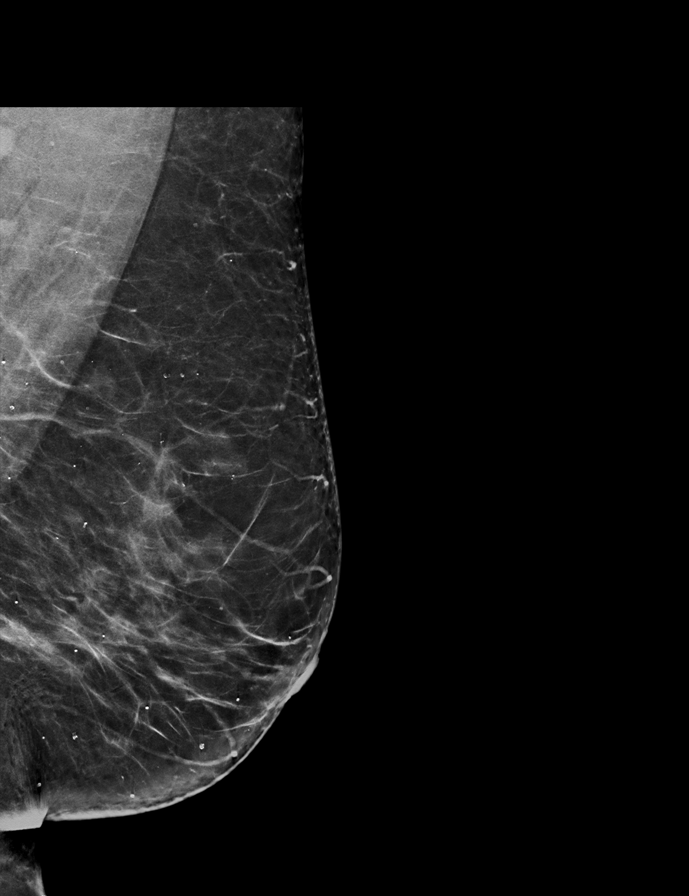

[L CC tomo · 2 of 61 frames shown]
[frame 20/61]
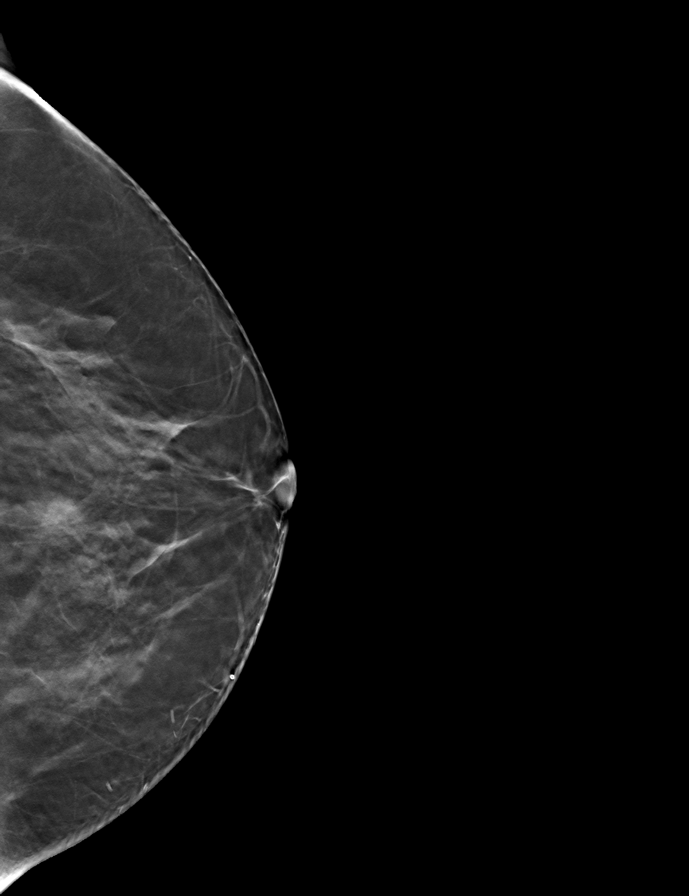
[frame 31/61]
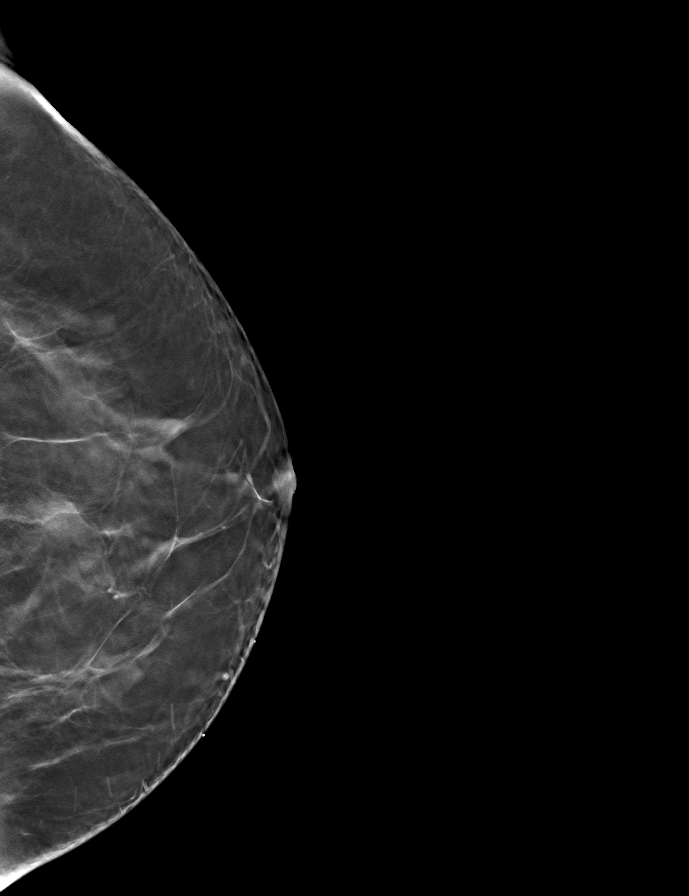

[R MLO tomo · tomo slice 35/68.0]
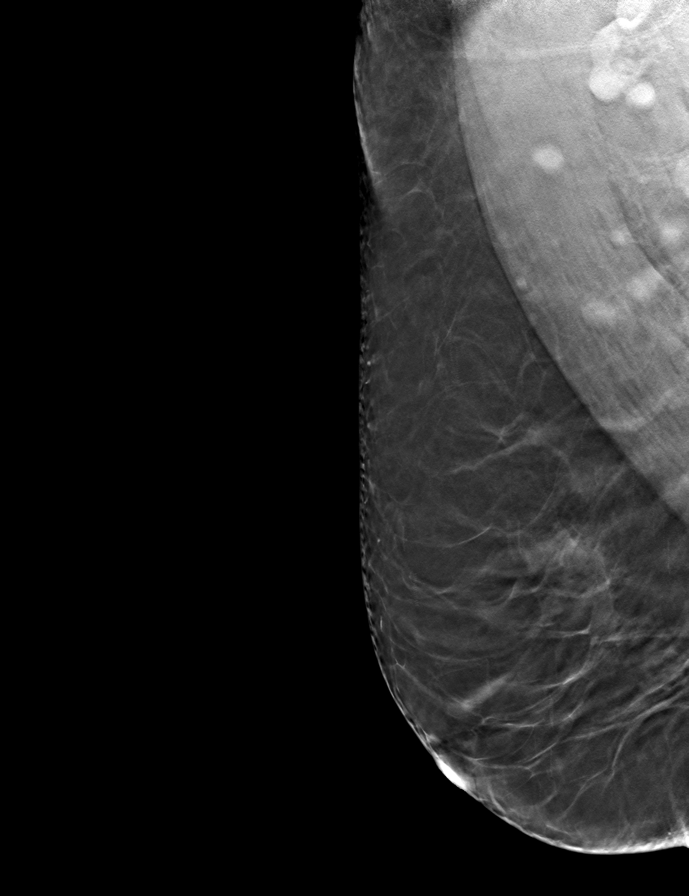

[L MLO tomo · tomo slice 35/70.0]
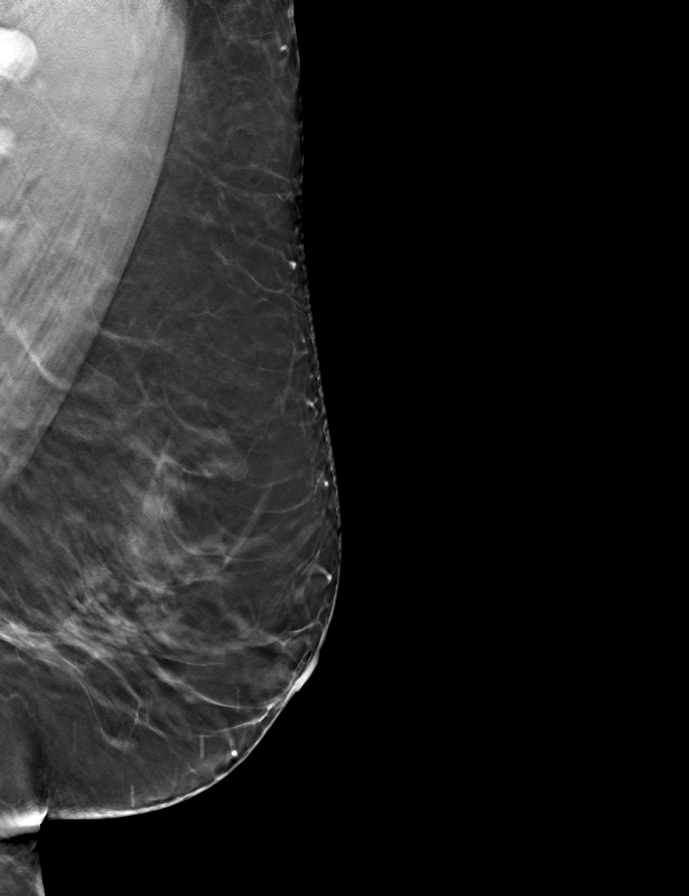

[R CC tomo · tomo slice 33/65.0]
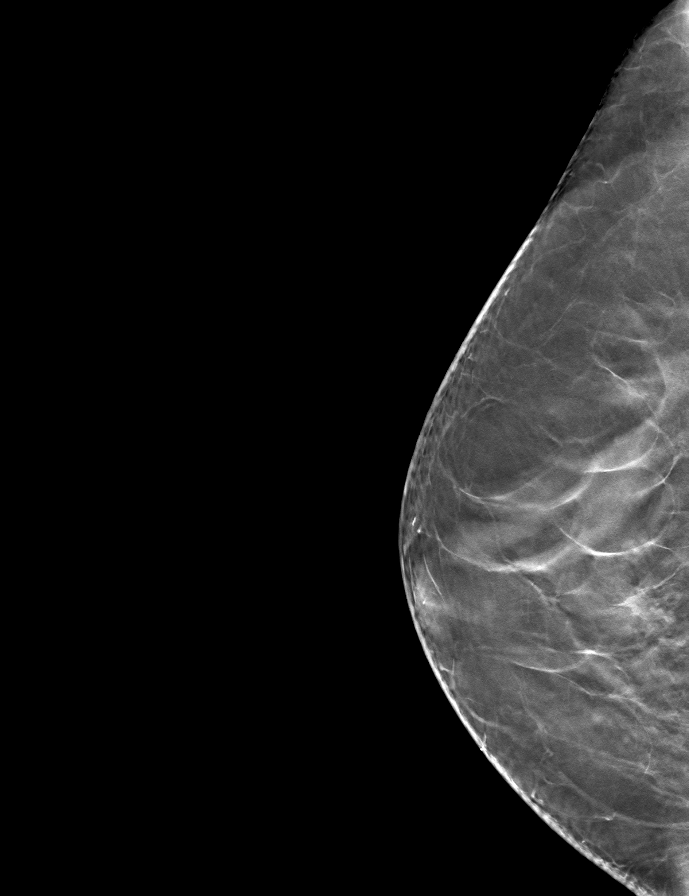

[9 of 24 positions shown; findings below may reference images not displayed]

ACR Breast Density Category c: The breast tissue is heterogeneously
dense, which may obscure small masses.
FINDINGS: There are no findings suspicious for malignancy.
IMPRESSION: No mammographic evidence of malignancy. A result letter of this
screening mammogram will be mailed directly to the patient.

RECOMMENDATION:
Screening mammogram in one year. (Code:Q3-W-BC3)

BI-RADS CATEGORY  1: Negative.

## 2022-12-01 DIAGNOSIS — I10 Essential (primary) hypertension: Secondary | ICD-10-CM | POA: Diagnosis not present

## 2022-12-01 DIAGNOSIS — N179 Acute kidney failure, unspecified: Secondary | ICD-10-CM | POA: Diagnosis not present

## 2022-12-02 LAB — BASIC METABOLIC PANEL
BUN/Creatinine Ratio: 17 (ref 12–28)
BUN: 15 mg/dL (ref 8–27)
CO2: 23 mmol/L (ref 20–29)
Calcium: 9.4 mg/dL (ref 8.7–10.3)
Chloride: 99 mmol/L (ref 96–106)
Creatinine, Ser: 0.89 mg/dL (ref 0.57–1.00)
Glucose: 147 mg/dL — ABNORMAL HIGH (ref 70–99)
Potassium: 4.1 mmol/L (ref 3.5–5.2)
Sodium: 140 mmol/L (ref 134–144)
eGFR: 68 mL/min/{1.73_m2} (ref >59)

## 2022-12-09 DIAGNOSIS — Z961 Presence of intraocular lens: Secondary | ICD-10-CM | POA: Diagnosis not present

## 2022-12-14 ENCOUNTER — Other Ambulatory Visit: Payer: Self-pay | Admitting: Internal Medicine

## 2022-12-14 DIAGNOSIS — E782 Mixed hyperlipidemia: Secondary | ICD-10-CM

## 2022-12-14 DIAGNOSIS — F411 Generalized anxiety disorder: Secondary | ICD-10-CM

## 2022-12-15 ENCOUNTER — Other Ambulatory Visit: Payer: Self-pay | Admitting: Internal Medicine

## 2022-12-15 DIAGNOSIS — I10 Essential (primary) hypertension: Secondary | ICD-10-CM

## 2023-01-21 ENCOUNTER — Other Ambulatory Visit: Payer: Self-pay | Admitting: Internal Medicine

## 2023-01-21 DIAGNOSIS — E1169 Type 2 diabetes mellitus with other specified complication: Secondary | ICD-10-CM

## 2023-02-15 ENCOUNTER — Other Ambulatory Visit: Payer: Self-pay | Admitting: Internal Medicine

## 2023-02-15 DIAGNOSIS — I10 Essential (primary) hypertension: Secondary | ICD-10-CM

## 2023-03-06 ENCOUNTER — Ambulatory Visit: Payer: Medicare PPO | Admitting: Internal Medicine

## 2023-03-06 ENCOUNTER — Encounter: Payer: Self-pay | Admitting: Internal Medicine

## 2023-03-06 VITALS — BP 134/77 | HR 73 | Ht 59.0 in | Wt 156.0 lb

## 2023-03-06 DIAGNOSIS — E782 Mixed hyperlipidemia: Secondary | ICD-10-CM

## 2023-03-06 DIAGNOSIS — E1169 Type 2 diabetes mellitus with other specified complication: Secondary | ICD-10-CM

## 2023-03-06 DIAGNOSIS — Z7985 Long-term (current) use of injectable non-insulin antidiabetic drugs: Secondary | ICD-10-CM | POA: Diagnosis not present

## 2023-03-06 DIAGNOSIS — Z79899 Other long term (current) drug therapy: Secondary | ICD-10-CM | POA: Diagnosis not present

## 2023-03-06 DIAGNOSIS — L600 Ingrowing nail: Secondary | ICD-10-CM | POA: Diagnosis not present

## 2023-03-06 DIAGNOSIS — I10 Essential (primary) hypertension: Secondary | ICD-10-CM

## 2023-03-06 DIAGNOSIS — J449 Chronic obstructive pulmonary disease, unspecified: Secondary | ICD-10-CM | POA: Diagnosis not present

## 2023-03-06 DIAGNOSIS — F411 Generalized anxiety disorder: Secondary | ICD-10-CM

## 2023-03-06 DIAGNOSIS — E559 Vitamin D deficiency, unspecified: Secondary | ICD-10-CM | POA: Diagnosis not present

## 2023-03-06 MED ORDER — ALPRAZOLAM 0.5 MG PO TABS: ORAL_TABLET | ORAL | 3 refills | Status: DC

## 2023-03-06 NOTE — Assessment & Plan Note (Signed)
BP Readings from Last 1 Encounters:  03/06/23 134/77   Well-controlled with amlodipine 10 mg once daily, losartan 25 mg QD and hydrochlorothiazide 12.5 mg QD Counseled for compliance with the medications Advised DASH diet and moderate exercise/walking, at least 150 mins/week

## 2023-03-06 NOTE — Assessment & Plan Note (Addendum)
Usually well-controlled with Effexor and Xanax Has persistent insomnia despite taking Xanax 0.5 mg at bedtime, increased nighttime dose of Xanax to 1 mg PDMP reviewed Check urine tox assure Advised to avoid any illicit drug including marijuana

## 2023-03-06 NOTE — Progress Notes (Signed)
Established Patient Office Visit  Subjective:  Patient ID: Robin Bridges, female    DOB: 1946-02-13  Age: 77 y.o. MRN: [id number]  CC:  Chief Complaint  Patient presents with   Hypertension    Four month follow up     HPI [name] is a 77 y.o. female with past medical history of HTN, type 2 DM, HLD, COPD, GERD, anxiety and tobacco abuse who presents for f/u of her chronic medical conditions.  GAD: She has been taking Effexor and as needed Xanax.  Denies any anhedonia, SI or HI currently. She still reports insomnia, and she has to wake up early to take her Grandkid to school.  BP is well-controlled. Takes medications regularly. Patient denies headache, dizziness, chest pain, dyspnea or palpitations.   Type 2 DM: Her HbA1c was 6.2 today, improved from 7.0 in 04/24.  She has started taking Ozempic 0.25 mg QW, and has been tolerating it well.  She has lost about 8 pounds since the last visit.  Her diarrhea has resolved since stopping metformin.  She denies polyuria and polyphagia.  She has ingrown toenail of right great toe, and also has bilateral callus formation on the lateral borders.  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/13/2022   Mixed hyperlipidemia 11/12/2015   Neuromuscular disorder (HCC)    OSA (obstructive sleep apnea) 06/30/2022   Osteoporosis    Stroke (HCC)    Ulcer     Past Surgical History:  Procedure Laterality Date   ABDOMINAL HYSTERECTOMY     COLONOSCOPY N/A 02/19/2017   Procedure: COLONOSCOPY;  Surgeon: Malissa Hippo, MD;  Location: [facility];  Service: Endoscopy;  Laterality: N/A;  830    EYE SURGERY     FRACTURE SURGERY     SHOULDER ARTHROSCOPY WITH OPEN ROTATOR CUFF REPAIR Right 03/25/2022   Procedure: SHOULDER ARTHROSCOPY WITH MINI OPEN ROTATOR CUFF REPAIR;  Surgeon: Vickki Hearing, MD;  Location: [facility];  Service: Orthopedics;  Laterality: Right;   TUBAL LIGATION      Family History  Problem Relation Age of Onset   Breast cancer Maternal Aunt     Social History   Socioeconomic History   Marital status: Single    Spouse name: Not on file   Number of children: Not on file   Years of education: Not on file   Highest education level: Some college, no degree  Occupational History   Not on file  Tobacco Use   Smoking status: Former    Types: Cigars   Smokeless tobacco: Never  Substance and Sexual Activity   Alcohol use: Yes    Alcohol/week: 1.0 standard drink of alcohol    Types: 1 Cans of beer per week    Comment: some days   Drug use: Yes    Types: Marijuana    Comment: here and there for pain   Sexual activity: Not on file  Other Topics Concern   Not on file  Social History Narrative   Not on file   Social Drivers of Health   Financial Resource Strain: Medium Risk (03/04/2023)   Overall  Financial Resource Strain (CARDIA)    Difficulty of Paying Living Expenses: Somewhat hard  Food Insecurity: Food Insecurity Present (03/04/2023)   Hunger Vital Sign    Worried About Running Out of Food in the Last Year: Often true    Ran Out of Food in the Last Year: Sometimes true  Transportation Needs: No Transportation Needs (03/04/2023)   PRAPARE - Administrator, Civil Service (Medical): No    Lack of Transportation (Non-Medical): No  Physical Activity: Sufficiently Active (03/04/2023)   Exercise Vital Sign    Days of Exercise per Week: 5 days    Minutes of Exercise per Session: 60 min  Stress: Stress Concern Present (03/04/2023)   [employer] of Occupational Health - Occupational Stress Questionnaire    Feeling of Stress : Very much   Social Connections: Moderately Integrated (03/04/2023)   Social Connection and Isolation Panel [NHANES]    Frequency of Communication with Friends and Family: More than three times a week    Frequency of Social Gatherings with Friends and Family: More than three times a week    Attends Religious Services: More than 4 times per year    Active Member of [employer] or Organizations: Yes    Attends Banker Meetings: More than 4 times per year    Marital Status: Widowed  Intimate Partner Violence: Not At Risk (10/07/2022)   Humiliation, Afraid, Rape, and Kick questionnaire    Fear of Current or Ex-Partner: No    Emotionally Abused: No    Physically Abused: No    Sexually Abused: No    Outpatient Medications Prior to Visit  Medication Sig Dispense Refill   albuterol (VENTOLIN HFA) 108 (90 Base) MCG/ACT inhaler Inhale 2 puffs into the lungs every 6 (six) hours as needed for wheezing or shortness of breath. 8 g 4   amLODipine (NORVASC) 10 MG tablet TAKE 1 TABLET BY MOUTH EVERY DAY 90 tablet 3   aspirin EC 81 MG tablet Take 81 mg by mouth daily.     atorvastatin (LIPITOR) 20 MG tablet TAKE 1 TABLET BY MOUTH DAILY 90 tablet 1   blood glucose meter kit and supplies KIT Dispense based on patient and insurance preference. Use up to four times daily as directed. 1 each 0   BREZTRI AEROSPHERE 160-9-4.8 MCG/ACT AERO INHALE TWO PUFFS BY MOUTH TWICE DAILY 10.7 g 11   cholecalciferol (VITAMIN D3) 25 MCG (1000 UNIT) tablet Take 1,000 Units by mouth daily.     esomeprazole (NEXIUM) 40 MG capsule Take 1 capsule (40 mg total) by mouth daily. 90 capsule 3   fluticasone (FLONASE) 50 MCG/ACT nasal spray Place 2 sprays into both nostrils daily. (Patient taking differently: Place 2 sprays into both nostrils daily as needed for allergies.) 16 g 6   hydrochlorothiazide (MICROZIDE) 12.5 MG capsule Take 12.5 mg by mouth daily.     losartan (COZAAR) 25 MG tablet TAKE 1 TABLET BY MOUTH ONCE DAILY 30 tablet 1    montelukast (SINGULAIR) 10 MG tablet TAKE 1 TABLET BY MOUTH AT BEDTIME 90 tablet 3   polyvinyl alcohol (LIQUIFILM TEARS) 1.4 % ophthalmic solution Place 1 drop into both eyes as needed for dry eyes.     Semaglutide,0.25 or 0.5MG /DOS, (OZEMPIC, 0.25 OR 0.5 MG/DOSE,) 2 MG/3ML SOPN Inject 0.5 mg into the skin every 7 (seven) days. 3 mL 2   tacrolimus (PROTOPIC) 0.1 % ointment Apply topically 2 (two) times daily. (Patient taking differently: Apply 1 Application topically daily as needed (rash).)  100 g 0   triamcinolone cream (KENALOG) 0.1 % Apply 1 Application topically 2 (two) times daily. (Patient taking differently: Apply 1 Application topically 2 (two) times daily as needed (rash).) 30 g 0   venlafaxine XR (EFFEXOR-XR) 75 MG 24 hr capsule TAKE ONE CAPSULE BY MOUTH DAILY WITH BREAKFAST 90 capsule 1   ALPRAZolam (XANAX) 0.5 MG tablet TAKE 1 TABLET BY MOUTH TWICE DAILY AS NEEDED FOR ANXIETY 60 tablet 3   No facility-administered medications prior to visit.    No Known Allergies  ROS Review of Systems  Constitutional:  Negative for chills and fever.  HENT:  Negative for congestion, postnasal drip, sinus pain and sore throat.   Eyes:  Negative for pain and discharge.  Respiratory:  Negative for cough, shortness of breath and wheezing.   Cardiovascular:  Negative for chest pain and palpitations.  Gastrointestinal:  Negative for abdominal pain, diarrhea, nausea and vomiting.  Endocrine: Negative for polydipsia and polyuria.  Genitourinary:  Negative for dysuria and hematuria.  Musculoskeletal:  Negative for neck pain and neck stiffness.  Skin:  Negative for rash.  Neurological:  Negative for dizziness and weakness.  Psychiatric/Behavioral:  Negative for agitation and behavioral problems.       Objective:    Physical Exam Vitals reviewed.  Constitutional:      General: She is not in acute distress.    Appearance: She is not diaphoretic.  HENT:     Head: Normocephalic and atraumatic.      Nose: No congestion.     Mouth/Throat:     Mouth: Mucous membranes are moist.  Eyes:     General: No scleral icterus.    Extraocular Movements: Extraocular movements intact.  Cardiovascular:     Rate and Rhythm: Normal rate and regular rhythm.     Pulses: Normal pulses.     Heart sounds: Normal heart sounds. No murmur heard. Pulmonary:     Breath sounds: Normal breath sounds. No wheezing or rales.  Musculoskeletal:     Cervical back: Neck supple. No tenderness.     Right lower leg: No edema.     Left lower leg: No edema.  Feet:     Right foot:     Toenail Condition: Right toenails are abnormally thick and ingrown.     Left foot:     Toenail Condition: Left toenails are abnormally thick and ingrown.  Skin:    General: Skin is warm.     Findings: No rash.     Comments: Skin tag over neck region  Neurological:     General: No focal deficit present.     Mental Status: She is alert and oriented to person, place, and time.     Sensory: No sensory deficit.     Motor: No weakness.  Psychiatric:        Mood and Affect: Mood normal.        Behavior: Behavior normal.     BP 134/77 (BP Location: Right Arm, Patient Position: Sitting, Cuff Size: Normal)   Pulse 73   Ht 4\' 11"  (1.499 m)   Wt 156 lb (70.8 kg)   SpO2 94%   BMI 31.51 kg/m  Wt Readings from Last 3 Encounters:  03/06/23 156 lb (70.8 kg)  11/03/22 164 lb (74.4 kg)  10/14/22 164 lb (74.4 kg)    No results found for: "TSH" Lab Results  Component Value Date   WBC 8.6 03/21/2022   HGB 14.1 03/21/2022   HCT 43.1 03/21/2022  MCV 87.8 03/21/2022   PLT 304 03/21/2022   Lab Results  Component Value Date   NA 140 12/01/2022   K 4.1 12/01/2022   CO2 23 12/01/2022   GLUCOSE 147 (H) 12/01/2022   BUN 15 12/01/2022   CREATININE 0.89 12/01/2022   BILITOT 0.3 06/26/2022   ALKPHOS 54 06/26/2022   AST 27 06/26/2022   ALT 27 06/26/2022   PROT 6.9 06/26/2022   ALBUMIN 4.7 06/26/2022   CALCIUM 9.4 12/01/2022    ANIONGAP 12 03/21/2022   EGFR 68 12/01/2022   Lab Results  Component Value Date   CHOL 192 06/26/2022   Lab Results  Component Value Date   HDL 60 06/26/2022   Lab Results  Component Value Date   LDLCALC 105 (H) 06/26/2022   Lab Results  Component Value Date   TRIG 153 (H) 06/26/2022   Lab Results  Component Value Date   CHOLHDL 3.2 06/26/2022   Lab Results  Component Value Date   HGBA1C 7.0 (H) 11/17/2022      Assessment & Plan:   Problem List Items Addressed This Visit       Cardiovascular and Mediastinum   Essential hypertension   BP Readings from Last 1 Encounters:  03/06/23 134/77   Well-controlled with amlodipine 10 mg once daily, losartan 25 mg QD and hydrochlorothiazide 12.5 mg QD Counseled for compliance with the medications Advised DASH diet and moderate exercise/walking, at least 150 mins/week      Relevant Orders   TSH   CMP14+EGFR   CBC with Differential/Platelet     Respiratory   Chronic obstructive pulmonary disease (COPD) (HCC) (Chronic)   Overall has been well controlled with Breztri and as needed albuterol        Endocrine   Type 2 diabetes mellitus with other specified complication (HCC) - Primary   Lab Results  Component Value Date   HGBA1C 7.0 (H) 11/17/2022   Overall well controlled Associated with HTN and HLD On Ozempic-plan to increase dose as tolerated, advised to take 0.5 mg dose now Advised to follow diabetic diet On statin F/u CMP and lipid panel Diabetic eye exam: Advised to follow up with Ophthalmology for diabetic eye exam      Relevant Orders   Bayer DCA Hb A1c Waived   Hemoglobin A1c   CMP14+EGFR   Urine Microalbumin w/creat. ratio     Musculoskeletal and Integument   Ingrown toenail of right foot   Also has bilateral foot callus Referred to podiatry      Relevant Orders   Ambulatory referral to Podiatry     Other   GAD (generalized anxiety disorder) (Chronic)   Usually well-controlled with Effexor  and Xanax Has persistent insomnia despite taking Xanax 0.5 mg at bedtime, increased nighttime dose of Xanax to 1 mg PDMP reviewed Check urine tox assure Advised to avoid any illicit drug including marijuana      Relevant Medications   ALPRAZolam (XANAX) 0.5 MG tablet   Mixed hyperlipidemia   Lipid profile reviewed On Atorvastatin 20 mg QD      Relevant Orders   Lipid panel   Other Visit Diagnoses       Chronic prescription benzodiazepine use       Relevant Orders   ToxASSURE Select 13 (MW), Urine     Vitamin D deficiency       Relevant Orders   VITAMIN D 25 Hydroxy (Vit-D Deficiency, Fractures)       Meds ordered this encounter  Medications  ALPRAZolam (XANAX) 0.5 MG tablet    Sig: Take 1 tablet (0.5 mg total) by mouth in the morning. May also take 2 tablets (1 mg total) at bedtime as needed. for anxiety.    Dispense:  90 tablet    Refill:  3    Follow-up: Return in about 4 months (around 07/05/2023) for Annual physical (after 06/30/23).    Anabel Halon, MD

## 2023-03-06 NOTE — Assessment & Plan Note (Addendum)
Lab Results  Component Value Date   HGBA1C 7.0 (H) 11/17/2022   Overall well controlled Associated with HTN and HLD On Ozempic-plan to increase dose as tolerated, advised to take 0.5 mg dose now Advised to follow diabetic diet On statin F/u CMP and lipid panel Diabetic eye exam: Advised to follow up with Ophthalmology for diabetic eye exam

## 2023-03-06 NOTE — Assessment & Plan Note (Signed)
Lipid profile reviewed On Atorvastatin 20 mg QD

## 2023-03-06 NOTE — Patient Instructions (Addendum)
Please start taking Ozempic 0.5 mg once weekly.  Please continue taking Xanax twice daily as needed for anxiety. Okay to take additional tablet at nighttime only as needed for insomnia.  Please continue to take other medications as prescribed.  Please continue to follow low carb diet and perform moderate exercise/walking at least 150 mins/week.

## 2023-03-06 NOTE — Assessment & Plan Note (Signed)
Overall has been well controlled with Breztri and as needed albuterol

## 2023-03-06 NOTE — Assessment & Plan Note (Signed)
Also has bilateral foot callus Referred to podiatry

## 2023-03-08 LAB — MICROALBUMIN / CREATININE URINE RATIO
Creatinine, Urine: 94.5 mg/dL
Microalb/Creat Ratio: 8 mg/g{creat} (ref 0–29)
Microalbumin, Urine: 7.2 ug/mL

## 2023-03-10 LAB — BAYER DCA HB A1C WAIVED: HB A1C (BAYER DCA - WAIVED): 6.2 % — ABNORMAL HIGH (ref 4.8–5.6)

## 2023-03-10 LAB — TOXASSURE SELECT 13 (MW), URINE

## 2023-03-14 ENCOUNTER — Other Ambulatory Visit: Payer: Self-pay | Admitting: Internal Medicine

## 2023-03-14 DIAGNOSIS — E1169 Type 2 diabetes mellitus with other specified complication: Secondary | ICD-10-CM

## 2023-03-16 ENCOUNTER — Ambulatory Visit: Payer: Self-pay | Admitting: Podiatry

## 2023-03-24 ENCOUNTER — Encounter: Payer: Self-pay | Admitting: Podiatry

## 2023-03-24 ENCOUNTER — Ambulatory Visit: Payer: Medicare PPO | Admitting: Podiatry

## 2023-03-24 DIAGNOSIS — L84 Corns and callosities: Secondary | ICD-10-CM

## 2023-03-24 DIAGNOSIS — B351 Tinea unguium: Secondary | ICD-10-CM | POA: Diagnosis not present

## 2023-03-24 DIAGNOSIS — E1169 Type 2 diabetes mellitus with other specified complication: Secondary | ICD-10-CM | POA: Diagnosis not present

## 2023-03-24 DIAGNOSIS — M79675 Pain in left toe(s): Secondary | ICD-10-CM | POA: Diagnosis not present

## 2023-03-24 DIAGNOSIS — M79674 Pain in right toe(s): Secondary | ICD-10-CM

## 2023-03-24 NOTE — Progress Notes (Signed)
  Subjective:  Patient ID: Robin Bridges, female    DOB: 06-16-1945,   MRN: [id number]  Chief Complaint  Patient presents with   Nail Problem    Pt presents for a diabetic foot care possible ingrown toenail of right great toe.    78 y.o. female presents for concern of thickened elongated and painful nails that are difficult to trim. Requesting to have them trimmed today. Relates burning and tingling in their feet. Patient is diabetic and last A1c was  Lab Results  Component Value Date   HGBA1C 6.2 (H) 03/06/2023   .   PCP:  Tobie Suzzane POUR, MD    . Denies any other pedal complaints. Denies n/v/f/c.   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 CFT <3 seconds. Absent hair growth on digits. Edema noted to bilateral lower extremities. Xerosis noted bilaterally.  Skin. No lacerations or abrasions bilateral feet. Nails 1-5 bilateral  are thickened discolored and elongated with subungual debris. Hyperkeratotic lesions noted sub fifth metatarsal bilateral.  Musculoskeletal: MMT 5/5 bilateral lower extremities in DF, PF, Inversion and Eversion. Deceased ROM in DF of ankle joint.  Neurological: Sensation intact to light touch. Protective sensation diminished bilateral.  .   Assessment:   1. Pain due to onychomycosis of toenails of both feet   2. Type 2 diabetes mellitus with other specified complication, without  long-term current use of insulin (HCC)   3. Callus of foot      Plan:  Patient was evaluated and treated and all questions answered. -Discussed and educated patient on diabetic foot care, especially with  regards to the vascular, neurological and musculoskeletal systems.  -Stressed the importance of good glycemic control and the detriment of not  controlling glucose levels in relation to the foot. -Discussed supportive shoes at all times and checking feet regularly.  -Mechanically debrided all nails 1-5 bilateral using sterile nail nipper and filed with dremel without incident  -Hyperkeratotic tissue debrided x 2 without incident with chisel.  -Answered all patient questions -Patient to return  in 3 months for at risk foot care -Patient advised to call the office if any problems or questions arise in the meantime.    Asberry Failing, DPM

## 2023-04-18 ENCOUNTER — Other Ambulatory Visit: Payer: Self-pay | Admitting: Internal Medicine

## 2023-04-18 DIAGNOSIS — E1169 Type 2 diabetes mellitus with other specified complication: Secondary | ICD-10-CM

## 2023-05-14 ENCOUNTER — Other Ambulatory Visit: Payer: Self-pay | Admitting: Internal Medicine

## 2023-05-14 DIAGNOSIS — I10 Essential (primary) hypertension: Secondary | ICD-10-CM

## 2023-05-27 ENCOUNTER — Other Ambulatory Visit: Payer: Self-pay | Admitting: Internal Medicine

## 2023-05-27 DIAGNOSIS — J441 Chronic obstructive pulmonary disease with (acute) exacerbation: Secondary | ICD-10-CM

## 2023-06-13 ENCOUNTER — Other Ambulatory Visit: Payer: Self-pay | Admitting: Internal Medicine

## 2023-06-13 DIAGNOSIS — F411 Generalized anxiety disorder: Secondary | ICD-10-CM

## 2023-06-13 DIAGNOSIS — I10 Essential (primary) hypertension: Secondary | ICD-10-CM

## 2023-06-13 DIAGNOSIS — K219 Gastro-esophageal reflux disease without esophagitis: Secondary | ICD-10-CM

## 2023-06-13 DIAGNOSIS — E782 Mixed hyperlipidemia: Secondary | ICD-10-CM

## 2023-06-13 DIAGNOSIS — J309 Allergic rhinitis, unspecified: Secondary | ICD-10-CM

## 2023-06-22 ENCOUNTER — Ambulatory Visit: Payer: Medicare PPO | Admitting: Podiatry

## 2023-06-22 DIAGNOSIS — M79674 Pain in right toe(s): Secondary | ICD-10-CM

## 2023-06-22 DIAGNOSIS — M79675 Pain in left toe(s): Secondary | ICD-10-CM | POA: Diagnosis not present

## 2023-06-22 DIAGNOSIS — L84 Corns and callosities: Secondary | ICD-10-CM | POA: Diagnosis not present

## 2023-06-22 DIAGNOSIS — B351 Tinea unguium: Secondary | ICD-10-CM | POA: Diagnosis not present

## 2023-06-22 DIAGNOSIS — E1169 Type 2 diabetes mellitus with other specified complication: Secondary | ICD-10-CM | POA: Diagnosis not present

## 2023-06-22 NOTE — Progress Notes (Signed)
  Subjective:  Patient ID: Robin Bridges, female    DOB: 02-05-1946,   MRN: [id number]  No chief complaint on file.   78 y.o. female presents for concern of thickened elongated and painful nails that are difficult to trim. Requesting to have them trimmed today. Relates burning and tingling in their feet. Patient is diabetic and last A1c was  Lab Results  Component Value Date   HGBA1C 6.2 (H) 03/06/2023   .   PCP:  Meldon Sport, MD    . Denies any other pedal complaints. Denies n/v/f/c.   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 CFT <3 seconds. Absent hair growth on digits. Edema noted to bilateral lower extremities. Xerosis noted bilaterally.  Skin. No lacerations or abrasions bilateral feet. Nails 1-5 bilateral  are thickened discolored and elongated with subungual debris. Hyperkeratotic lesions noted sub fifth metatarsal bilateral.  Musculoskeletal: MMT 5/5 bilateral lower extremities in DF, PF, Inversion and Eversion. Deceased ROM in DF of ankle joint.  Neurological: Sensation intact to light touch. Protective sensation diminished bilateral.  .   Assessment:   1. Pain due to onychomycosis of toenails of both feet   2. Type 2 diabetes mellitus with other specified complication, without long-term current use of insulin (HCC)   3. Callus of foot       Plan:  Patient was evaluated and treated  and all questions answered. -Discussed and educated patient on diabetic foot care, especially with  regards to the vascular, neurological and musculoskeletal systems.  -Stressed the importance of good glycemic control and the detriment of not  controlling glucose levels in relation to the foot. -Discussed supportive shoes at all times and checking feet regularly.  -Mechanically debrided all nails 1-5 bilateral using sterile nail nipper and filed with dremel without incident  -Hyperkeratotic tissue debrided x 2 without incident with chisel.  -Answered all patient questions -Patient to return  in 3 months for at risk foot care -Patient advised to call the office if any problems or questions arise in the meantime.    Jennefer Moats, DPM

## 2023-07-02 ENCOUNTER — Other Ambulatory Visit (HOSPITAL_COMMUNITY): Payer: Self-pay | Admitting: Internal Medicine

## 2023-07-02 DIAGNOSIS — Z1231 Encounter for screening mammogram for malignant neoplasm of breast: Secondary | ICD-10-CM

## 2023-07-03 DIAGNOSIS — E782 Mixed hyperlipidemia: Secondary | ICD-10-CM | POA: Diagnosis not present

## 2023-07-03 DIAGNOSIS — E1169 Type 2 diabetes mellitus with other specified complication: Secondary | ICD-10-CM | POA: Diagnosis not present

## 2023-07-03 DIAGNOSIS — E559 Vitamin D deficiency, unspecified: Secondary | ICD-10-CM | POA: Diagnosis not present

## 2023-07-03 DIAGNOSIS — I10 Essential (primary) hypertension: Secondary | ICD-10-CM | POA: Diagnosis not present

## 2023-07-04 LAB — CBC WITH DIFFERENTIAL/PLATELET
Basophils Absolute: 0 10*3/uL (ref 0.0–0.2)
Basos: 1 %
EOS (ABSOLUTE): 0.2 10*3/uL (ref 0.0–0.4)
Eos: 2 %
Hematocrit: 44.1 % (ref 34.0–46.6)
Hemoglobin: 13.8 g/dL (ref 11.1–15.9)
Immature Grans (Abs): 0 10*3/uL (ref 0.0–0.1)
Immature Granulocytes: 0 %
Lymphocytes Absolute: 2.1 10*3/uL (ref 0.7–3.1)
Lymphs: 26 %
MCH: 27.7 pg (ref 26.6–33.0)
MCHC: 31.3 g/dL — ABNORMAL LOW (ref 31.5–35.7)
MCV: 88 fL (ref 79–97)
Monocytes Absolute: 0.5 10*3/uL (ref 0.1–0.9)
Monocytes: 7 %
Neutrophils Absolute: 5.2 10*3/uL (ref 1.4–7.0)
Neutrophils: 64 %
Platelets: 331 10*3/uL (ref 150–450)
RBC: 4.99 x10E6/uL (ref 3.77–5.28)
RDW: 14.2 % (ref 11.7–15.4)
WBC: 8.1 10*3/uL (ref 3.4–10.8)

## 2023-07-04 LAB — CMP14+EGFR
ALT: 23 IU/L (ref 0–32)
AST: 25 IU/L (ref 0–40)
Albumin: 4.3 g/dL (ref 3.8–4.8)
Alkaline Phosphatase: 54 IU/L (ref 44–121)
BUN/Creatinine Ratio: 24 (ref 12–28)
BUN: 23 mg/dL (ref 8–27)
Bilirubin Total: 0.3 mg/dL (ref 0.0–1.2)
CO2: 20 mmol/L (ref 20–29)
Calcium: 9.4 mg/dL (ref 8.7–10.3)
Chloride: 105 mmol/L (ref 96–106)
Creatinine, Ser: 0.94 mg/dL (ref 0.57–1.00)
Globulin, Total: 2.3 g/dL (ref 1.5–4.5)
Glucose: 103 mg/dL — ABNORMAL HIGH (ref 70–99)
Potassium: 4.8 mmol/L (ref 3.5–5.2)
Sodium: 141 mmol/L (ref 134–144)
Total Protein: 6.6 g/dL (ref 6.0–8.5)
eGFR: 63 mL/min/{1.73_m2} (ref >59)

## 2023-07-04 LAB — VITAMIN D 25 HYDROXY (VIT D DEFICIENCY, FRACTURES): Vit D, 25-Hydroxy: 39.1 ng/mL (ref 30.0–100.0)

## 2023-07-04 LAB — HEMOGLOBIN A1C
Est. average glucose Bld gHb Est-mCnc: 123 mg/dL
Hgb A1c MFr Bld: 5.9 % — ABNORMAL HIGH (ref 4.8–5.6)

## 2023-07-04 LAB — LIPID PANEL
Chol/HDL Ratio: 3.3 ratio (ref 0.0–4.4)
Cholesterol, Total: 192 mg/dL (ref 100–199)
HDL: 58 mg/dL (ref >39)
LDL Chol Calc (NIH): 107 mg/dL — ABNORMAL HIGH (ref 0–99)
Triglycerides: 156 mg/dL — ABNORMAL HIGH (ref 0–149)
VLDL Cholesterol Cal: 27 mg/dL (ref 5–40)

## 2023-07-04 LAB — TSH: TSH: 3.16 u[IU]/mL (ref 0.450–4.500)

## 2023-07-06 ENCOUNTER — Ambulatory Visit: Payer: Medicare PPO | Admitting: Internal Medicine

## 2023-07-06 ENCOUNTER — Encounter: Payer: Self-pay | Admitting: Internal Medicine

## 2023-07-06 VITALS — BP 128/78 | HR 88 | Ht 59.0 in | Wt 151.6 lb

## 2023-07-06 DIAGNOSIS — E1159 Type 2 diabetes mellitus with other circulatory complications: Secondary | ICD-10-CM | POA: Diagnosis not present

## 2023-07-06 DIAGNOSIS — E1169 Type 2 diabetes mellitus with other specified complication: Secondary | ICD-10-CM

## 2023-07-06 DIAGNOSIS — F331 Major depressive disorder, recurrent, moderate: Secondary | ICD-10-CM | POA: Diagnosis not present

## 2023-07-06 DIAGNOSIS — J449 Chronic obstructive pulmonary disease, unspecified: Secondary | ICD-10-CM | POA: Diagnosis not present

## 2023-07-06 DIAGNOSIS — I10 Essential (primary) hypertension: Secondary | ICD-10-CM

## 2023-07-06 DIAGNOSIS — Z0001 Encounter for general adult medical examination with abnormal findings: Secondary | ICD-10-CM

## 2023-07-06 DIAGNOSIS — E782 Mixed hyperlipidemia: Secondary | ICD-10-CM

## 2023-07-06 DIAGNOSIS — Z7985 Long-term (current) use of injectable non-insulin antidiabetic drugs: Secondary | ICD-10-CM | POA: Diagnosis not present

## 2023-07-06 MED ORDER — LOSARTAN POTASSIUM 25 MG PO TABS: 25.0000 mg | ORAL_TABLET | Freq: Every day | ORAL | 3 refills | Status: AC

## 2023-07-06 NOTE — Assessment & Plan Note (Signed)
Overall has been well controlled with Breztri and as needed albuterol

## 2023-07-06 NOTE — Assessment & Plan Note (Addendum)
 Uncontrolled with Effexor  to 75 mg daily, due to recent stressors Would avoid changing medication for now as her PHQ-9 is skewed due to recent stressors

## 2023-07-06 NOTE — Assessment & Plan Note (Addendum)
 Physical exam as documented. Fasting blood tests reviewed and discussed with the patient today. Advised to get Shingrix vaccine at local pharmacy.

## 2023-07-06 NOTE — Assessment & Plan Note (Addendum)
 Lab Results  Component Value Date   HGBA1C 5.9 (H) 07/03/2023   Overall well controlled, was 7.0 in 04/24 Associated with HTN and HLD On Ozempic - advised to take 0.5 mg dose for now Advised to follow diabetic diet On statin F/u CMP and lipid panel Diabetic eye exam: Advised to follow up with Ophthalmology for diabetic eye exam

## 2023-07-06 NOTE — Progress Notes (Signed)
 Established Patient Office Visit  Subjective:  Patient ID: Robin Bridges, female    DOB: [birth year]  Age: 78 y.o. MRN: [id number]  CC:  Chief Complaint  Patient presents with   Annual Exam    Cpe/ 4 month f/u   Stress    Pt reports family stressors affecting her. Death in the family and other stress.     HPI Robin Bridges is a 78 y.o. female with past medical history of HTN, type 2 DM, HLD, COPD, GERD, anxiety and tobacco abuse who presents for f/u of her chronic medical conditions.  GAD: She has been taking Effexor  and as needed Xanax .  She has been stressed due to her grand daughter's being in jail due to her alcohol  abuse related loss of an infant.  Patient complains of anhedonia, insomnia and lack of appetite.  Denies SI or HI currently. She still reports insomnia, and she has to wake up early to take her Grandkid to school.  BP is well-controlled. Takes medications regularly. Patient denies headache, dizziness, chest pain, dyspnea or palpitations.   Type 2 DM: Her HbA1c is 5.9, improved from 7.0 in 04/24.  She has been taking Ozempic  0.5 mg QW, and has been tolerating it well.  She has lost about 5 pounds since the last visit.  Her diarrhea has resolved since stopping metformin .  She denies polyuria and polyphagia.  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/13/2022   Mixed hyperlipidemia 11/12/2015   Neuromuscular disorder (HCC)    OSA (obstructive sleep apnea) 06/30/2022   Osteoporosis    Stroke (HCC)    Ulcer     Past Surgical History:  Procedure Laterality Date   ABDOMINAL HYSTERECTOMY     COLONOSCOPY N/A  02/19/2017   Procedure: COLONOSCOPY;  Surgeon: Ruby Corporal, MD;  Location: [facility];  Service: Endoscopy;  Laterality: N/A;  830   EYE SURGERY     FRACTURE SURGERY     SHOULDER ARTHROSCOPY WITH OPEN ROTATOR CUFF REPAIR Right 03/25/2022   Procedure: SHOULDER ARTHROSCOPY WITH MINI OPEN ROTATOR CUFF REPAIR;  Surgeon: Darrin Emerald, MD;  Location: [facility];  Service: Orthopedics;  Laterality: Right;   TUBAL LIGATION      Family History  Problem Relation Age of Onset   Breast cancer Maternal Aunt     Social History   Socioeconomic History   Marital status: Single    Spouse name: Not on file   Number of children: Not on file   Years of education: Not on file   Highest education level: Some college, no degree  Occupational History   Not on file  Tobacco Use   Smoking status: Former    Types: Cigars   Smokeless tobacco: Never  Substance and Sexual Activity   Alcohol  use: Yes    Alcohol /week: 1.0 standard drink of alcohol     Types: 1 Cans of beer per week    Comment: some days   Drug use: Yes    Types: Marijuana    Comment: here and there for pain   Sexual activity: Not on file  Other Topics Concern  Not on file  Social History Narrative   Not on file   Social Drivers of Health   Financial Resource Strain: Medium Risk (03/04/2023)   Overall Financial Resource Strain (CARDIA)    Difficulty of Paying Living Expenses: Somewhat hard  Food Insecurity: Food Insecurity Present (03/04/2023)   Hunger Vital Sign    Worried About Running Out of Food in the Last Year: Often true    Ran Out of Food in the Last Year: Sometimes true  Transportation Needs: No Transportation Needs (03/04/2023)   PRAPARE - Administrator, Civil Service (Medical): No    Lack of Transportation (Non-Medical): No  Physical Activity: Sufficiently Active (03/04/2023)   Exercise Vital Sign    Days of Exercise per Week: 5 days    Minutes of Exercise per Session: 60 min  Stress: Stress  Concern Present (03/04/2023)   [employer] of Occupational Health - Occupational Stress Questionnaire    Feeling of Stress : Very much  Social Connections: Moderately Integrated (03/04/2023)   Social Connection and Isolation Panel [NHANES]    Frequency of Communication with Friends and Family: More than three times a week    Frequency of Social Gatherings with Friends and Family: More than three times a week    Attends Religious Services: More than 4 times per year    Active Member of [employer] or Organizations: Yes    Attends Banker Meetings: More than 4 times per year    Marital Status: Widowed  Intimate Partner Violence: Not At Risk (10/07/2022)   Humiliation, Afraid, Rape, and Kick questionnaire    Fear of Current or Ex-Partner: No    Emotionally Abused: No    Physically Abused: No    Sexually Abused: No    Outpatient Medications Prior to Visit  Medication Sig Dispense Refill   albuterol  (VENTOLIN  HFA) 108 (90 Base) MCG/ACT inhaler Inhale 2 puffs into the lungs every 6 (six) hours as needed for wheezing or shortness of breath. 8 g 4   ALPRAZolam  (XANAX ) 0.5 MG tablet Take 1 tablet (0.5 mg total) by mouth in the morning. May also take 2 tablets (1 mg total) at bedtime as needed. for anxiety. 90 tablet 3   amLODipine  (NORVASC ) 10 MG tablet TAKE 1 TABLET BY MOUTH EVERY DAY 90 tablet 3   aspirin EC 81 MG tablet Take 81 mg by mouth daily.     atorvastatin  (LIPITOR) 20 MG tablet TAKE 1 TABLET BY MOUTH DAILY 90 tablet 1   blood glucose meter kit and supplies KIT Dispense based on patient and insurance preference. Use up to four times daily as directed. 1 each 0   BREZTRI  AEROSPHERE 160-9-4.8 MCG/ACT AERO INHALE TWO PUFFS BY MOUTH TWICE DAILY 10.7 g 11   cholecalciferol (VITAMIN D3) 25 MCG (1000 UNIT) tablet Take 1,000 Units by mouth daily.     esomeprazole  (NEXIUM ) 40 MG capsule TAKE ONE CAPSULE BY MOUTH DAILY 90 capsule 3   fluticasone  (FLONASE ) 50 MCG/ACT nasal spray Place  2 sprays into both nostrils daily. (Patient taking differently: Place 2 sprays into both nostrils daily as needed for allergies.) 16 g 6   hydrochlorothiazide  (MICROZIDE ) 12.5 MG capsule TAKE ONE CAPSULE BY MOUTH EVERY DAY 90 capsule 1   metFORMIN  (GLUCOPHAGE -XR) 500 MG 24 hr tablet TAKE 1 TABLET BY MOUTH DAILY WITH BREAKFAST 90 tablet 2   montelukast  (SINGULAIR ) 10 MG tablet TAKE 1 TABLET BY MOUTH AT BEDTIME 90 tablet 3   polyvinyl alcohol  (LIQUIFILM TEARS) 1.4 %  ophthalmic solution Place 1 drop into both eyes as needed for dry eyes.     Semaglutide ,0.25 or 0.5MG /DOS, (OZEMPIC , 0.25 OR 0.5 MG/DOSE,) 2 MG/3ML SOPN INJECT 0.5 MG INTO THE SKIN EVERY 7 (SEVEN) DAYS. 2 mL 2   tacrolimus  (PROTOPIC ) 0.1 % ointment Apply topically 2 (two) times daily. (Patient taking differently: Apply 1 Application topically daily as needed (rash).) 100 g 0   triamcinolone  cream (KENALOG ) 0.1 % Apply 1 Application topically 2 (two) times daily. (Patient taking differently: Apply 1 Application topically 2 (two) times daily as needed (rash).) 30 g 0   venlafaxine  XR (EFFEXOR -XR) 75 MG 24 hr capsule TAKE ONE CAPSULE BY MOUTH DAILY WITH BREAKFAST 90 capsule 1   losartan  (COZAAR ) 25 MG tablet TAKE 1 TABLET BY MOUTH ONCE DAILY 30 tablet 1   No facility-administered medications prior to visit.    No Known Allergies  ROS Review of Systems  Constitutional:  Negative for chills and fever.  HENT:  Negative for congestion, postnasal drip, sinus pain and sore throat.   Eyes:  Negative for pain and discharge.  Respiratory:  Negative for cough, shortness of breath and wheezing.   Cardiovascular:  Negative for chest pain and palpitations.  Gastrointestinal:  Negative for abdominal pain, diarrhea, nausea and vomiting.  Endocrine: Negative for polydipsia and polyuria.  Genitourinary:  Negative for dysuria and hematuria.  Musculoskeletal:  Negative for neck pain and neck stiffness.  Skin:  Negative for rash.  Neurological:   Negative for dizziness and weakness.  Psychiatric/Behavioral:  Positive for dysphoric mood and sleep disturbance. Negative for agitation and behavioral problems. The patient is nervous/anxious.       Objective:    Physical Exam Vitals reviewed.  Constitutional:      General: She is not in acute distress.    Appearance: She is not diaphoretic.  HENT:     Head: Normocephalic and atraumatic.     Nose: No congestion.     Mouth/Throat:     Mouth: Mucous membranes are moist.  Eyes:     General: No scleral icterus.    Extraocular Movements: Extraocular movements intact.  Cardiovascular:     Rate and Rhythm: Normal rate and regular rhythm.     Heart sounds: Normal heart sounds. No murmur heard. Pulmonary:     Breath sounds: Normal breath sounds. No wheezing or rales.  Abdominal:     Palpations: Abdomen is soft.     Tenderness: There is no abdominal tenderness.  Musculoskeletal:     Cervical back: Neck supple. No tenderness.     Right lower leg: No edema.     Left lower leg: No edema.  Feet:     Right foot:     Toenail Condition: Right toenails are abnormally thick and ingrown.     Left foot:     Toenail Condition: Left toenails are abnormally thick and ingrown.  Skin:    General: Skin is warm.     Findings: No rash.     Comments: Skin tag over neck region  Neurological:     General: No focal deficit present.     Mental Status: She is alert and oriented to person, place, and time.     Sensory: No sensory deficit.     Motor: No weakness.  Psychiatric:        Mood and Affect: Mood is depressed.        Behavior: Behavior normal.     BP 128/78   Pulse 88   Ht  4\' 11"  (1.499 m)   Wt 151 lb 9.6 oz (68.8 kg)   SpO2 96%   BMI 30.62 kg/m  Wt Readings from Last 3 Encounters:  07/06/23 151 lb 9.6 oz (68.8 kg)  03/06/23 156 lb (70.8 kg)  11/03/22 164 lb (74.4 kg)    Lab Results  Component Value Date   TSH 3.160 07/03/2023   Lab Results  Component Value Date   WBC 8.1  07/03/2023   HGB 13.8 07/03/2023   HCT 44.1 07/03/2023   MCV 88 07/03/2023   PLT 331 07/03/2023   Lab Results  Component Value Date   NA 141 07/03/2023   K 4.8 07/03/2023   CO2 20 07/03/2023   GLUCOSE 103 (H) 07/03/2023   BUN 23 07/03/2023   CREATININE 0.94 07/03/2023   BILITOT 0.3 07/03/2023   ALKPHOS 54 07/03/2023   AST 25 07/03/2023   ALT 23 07/03/2023   PROT 6.6 07/03/2023   ALBUMIN 4.3 07/03/2023   CALCIUM  9.4 07/03/2023   ANIONGAP 12 03/21/2022   EGFR 63 07/03/2023   Lab Results  Component Value Date   CHOL 192 07/03/2023   Lab Results  Component Value Date   HDL 58 07/03/2023   Lab Results  Component Value Date   LDLCALC 107 (H) 07/03/2023   Lab Results  Component Value Date   TRIG 156 (H) 07/03/2023   Lab Results  Component Value Date   CHOLHDL 3.3 07/03/2023   Lab Results  Component Value Date   HGBA1C 5.9 (H) 07/03/2023      Assessment & Plan:   Problem List Items Addressed This Visit       Cardiovascular and Mediastinum   Essential hypertension   BP Readings from Last 1 Encounters:  07/06/23 128/78   Well-controlled with amlodipine  10 mg once daily, losartan  25 mg QD and hydrochlorothiazide  12.5 mg QD Counseled for compliance with the medications Advised DASH diet and moderate exercise/walking, at least 150 mins/week      Relevant Medications   losartan  (COZAAR ) 25 MG tablet     Respiratory   Chronic obstructive pulmonary disease (COPD) (HCC) (Chronic)   Overall has been well controlled with Breztri  and as needed albuterol         Endocrine   Type 2 diabetes mellitus with other specified complication (HCC)   Lab Results  Component Value Date   HGBA1C 5.9 (H) 07/03/2023   Overall well controlled, was 7.0 in 04/24 Associated with HTN and HLD On Ozempic - advised to take 0.5 mg dose for now Advised to follow diabetic diet On statin F/u CMP and lipid panel Diabetic eye exam: Advised to follow up with Ophthalmology for diabetic  eye exam      Relevant Medications   losartan  (COZAAR ) 25 MG tablet     Other   Mixed hyperlipidemia   Lipid profile reviewed On Atorvastatin  20 mg once daily, needs to improve diet      Relevant Medications   losartan  (COZAAR ) 25 MG tablet   Encounter for general adult medical examination with abnormal findings - Primary   Physical exam as documented. Fasting blood tests reviewed and discussed with the patient today. Advised to get Shingrix vaccine at local pharmacy.      Moderate episode of recurrent major depressive disorder (HCC)   Uncontrolled with Effexor  to 75 mg daily, due to recent stressors Would avoid changing medication for now as her PHQ-9 is skewed due to recent stressors        Meds ordered this  encounter  Medications   losartan  (COZAAR ) 25 MG tablet    Sig: Take 1 tablet (25 mg total) by mouth daily.    Dispense:  90 tablet    Refill:  3    Follow-up: Return in about 4 months (around 11/05/2023) for DM and MDD.    Meldon Sport, MD

## 2023-07-06 NOTE — Assessment & Plan Note (Signed)
 BP Readings from Last 1 Encounters:  07/06/23 128/78   Well-controlled with amlodipine  10 mg once daily, losartan  25 mg QD and hydrochlorothiazide  12.5 mg QD Counseled for compliance with the medications Advised DASH diet and moderate exercise/walking, at least 150 mins/week

## 2023-07-06 NOTE — Assessment & Plan Note (Signed)
 Lipid profile reviewed On Atorvastatin  20 mg once daily, needs to improve diet

## 2023-07-06 NOTE — Patient Instructions (Signed)
 Please continue to take medications as prescribed.  Please continue to follow low carb diet and perform moderate exercise/walking at least 150 mins/week.

## 2023-07-15 ENCOUNTER — Encounter (HOSPITAL_COMMUNITY): Payer: Self-pay

## 2023-07-22 ENCOUNTER — Ambulatory Visit (HOSPITAL_COMMUNITY)
Admission: RE | Admit: 2023-07-22 | Discharge: 2023-07-22 | Disposition: A | Discharge status: Home / Self Care | Source: Ambulatory Visit | Attending: Internal Medicine | Admitting: Internal Medicine

## 2023-07-22 ENCOUNTER — Ambulatory Visit (HOSPITAL_COMMUNITY)

## 2023-07-22 DIAGNOSIS — Z1231 Encounter for screening mammogram for malignant neoplasm of breast: Secondary | ICD-10-CM | POA: Insufficient documentation

## 2023-07-25 ENCOUNTER — Other Ambulatory Visit: Payer: Self-pay | Admitting: Internal Medicine

## 2023-07-25 DIAGNOSIS — E1169 Type 2 diabetes mellitus with other specified complication: Secondary | ICD-10-CM

## 2023-08-05 ENCOUNTER — Other Ambulatory Visit: Payer: Self-pay | Admitting: Internal Medicine

## 2023-08-05 DIAGNOSIS — F411 Generalized anxiety disorder: Secondary | ICD-10-CM

## 2023-08-28 NOTE — Progress Notes (Signed)
   08/28/2023  Patient ID: Robin Bridges, female   DOB: 05-18-1945, 78 y.o.   MRN: [id number]  Pharmacy Quality Measure Review  This patient is appearing on a report for being at risk of failing the adherence measure for cholesterol (statin) medications this calendar year.   Medication: atorvastatin  filled 06/23/23 for 90 DS  Insurance report was not up to date. No action needed at this time.   Rolando Cliche, PharmD, BCGP Clinical Pharmacist  [phone]

## 2023-09-21 ENCOUNTER — Ambulatory Visit: Admitting: Podiatry

## 2023-10-12 ENCOUNTER — Encounter: Payer: Self-pay | Admitting: Podiatry

## 2023-10-12 ENCOUNTER — Ambulatory Visit: Admitting: Podiatry

## 2023-10-12 DIAGNOSIS — E1169 Type 2 diabetes mellitus with other specified complication: Secondary | ICD-10-CM

## 2023-10-12 DIAGNOSIS — M79674 Pain in right toe(s): Secondary | ICD-10-CM

## 2023-10-12 DIAGNOSIS — B351 Tinea unguium: Secondary | ICD-10-CM

## 2023-10-12 DIAGNOSIS — M79675 Pain in left toe(s): Secondary | ICD-10-CM | POA: Diagnosis not present

## 2023-10-12 DIAGNOSIS — L84 Corns and callosities: Secondary | ICD-10-CM | POA: Diagnosis not present

## 2023-10-12 NOTE — Progress Notes (Signed)
  Subjective:  Patient ID: Robin Bridges, female    DOB: Aug 06, 1945,   MRN: [id number]  Chief Complaint  Patient presents with   Diabetes    She's going to do my nails.  Saw Dr. Tobie - 07/06/2022; A1c 5.9    78 y.o. female presents for concern of thickened elongated and painful nails that are difficult to trim. Requesting to have them trimmed today. Relates burning and tingling in their feet. Patient is diabetic and last A1c was  Lab Results  Component Value Date   HGBA1C 5.9 (H) 07/03/2023   .   PCP:  Tobie Suzzane POUR, MD    . Denies any other pedal complaints. Denies n/v/f/c.   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 CFT <3 seconds. Absent hair growth on digits. Edema noted to bilateral lower extremities. Xerosis noted bilaterally.  Skin. No lacerations or abrasions bilateral feet. Nails 1-5 bilateral  are thickened discolored and elongated with subungual debris. Hyperkeratotic lesions noted sub fifth metatarsal bilateral.  Musculoskeletal: MMT 5/5 bilateral lower extremities in DF, PF, Inversion and Eversion. Deceased ROM in DF of ankle joint.  Neurological: Sensation intact to light touch. Protective sensation diminished bilateral.  .   Assessment:   1. Pain due to onychomycosis of toenails of both feet   2. Type 2 diabetes mellitus with other specified complication, without long-term current  use of insulin (HCC)       Plan:  Patient was evaluated and treated and all questions answered. -Discussed and educated patient on diabetic foot care, especially with  regards to the vascular, neurological and musculoskeletal systems.  -Stressed the importance of good glycemic control and the detriment of not  controlling glucose levels in relation to the foot. -Discussed supportive shoes at all times and checking feet regularly.  -Mechanically debrided all nails 1-5 bilateral using sterile nail nipper and filed with dremel without incident  -Hyperkeratotic tissue debrided x 2 without incident with chisel to bilateral plantar fifth metatarsal heads.  -Answered all patient questions -Patient to return  in 3 months for at risk foot care -Patient advised to call the office if any problems or questions arise in the meantime.    Asberry Failing, DPM

## 2023-11-05 ENCOUNTER — Encounter: Payer: Self-pay | Admitting: Internal Medicine

## 2023-11-05 ENCOUNTER — Ambulatory Visit: Admitting: Internal Medicine

## 2023-11-05 VITALS — BP 136/70 | HR 74 | Ht 59.0 in | Wt 145.0 lb

## 2023-11-05 DIAGNOSIS — Z7985 Long-term (current) use of injectable non-insulin antidiabetic drugs: Secondary | ICD-10-CM

## 2023-11-05 DIAGNOSIS — I10 Essential (primary) hypertension: Secondary | ICD-10-CM

## 2023-11-05 DIAGNOSIS — E1169 Type 2 diabetes mellitus with other specified complication: Secondary | ICD-10-CM | POA: Diagnosis not present

## 2023-11-05 DIAGNOSIS — J449 Chronic obstructive pulmonary disease, unspecified: Secondary | ICD-10-CM

## 2023-11-05 DIAGNOSIS — F331 Major depressive disorder, recurrent, moderate: Secondary | ICD-10-CM

## 2023-11-05 DIAGNOSIS — F411 Generalized anxiety disorder: Secondary | ICD-10-CM | POA: Diagnosis not present

## 2023-11-05 DIAGNOSIS — M51362 Other intervertebral disc degeneration, lumbar region with discogenic back pain and lower extremity pain: Secondary | ICD-10-CM | POA: Diagnosis not present

## 2023-11-05 MED ORDER — HYDROCHLOROTHIAZIDE 12.5 MG PO CAPS: 12.5000 mg | ORAL_CAPSULE | Freq: Every day | ORAL | 3 refills | Status: AC

## 2023-11-05 NOTE — Progress Notes (Signed)
 Established Patient Office Visit  Subjective:  Patient ID: Robin Bridges, female    DOB: Jul 30, 1945  Age: 78 y.o. MRN: [id number]  CC:  Chief Complaint  Patient presents with   Hypertension    Follow up   sciatic nerve    Sciatic nerve flare up     HPI Robin Bridges is a 78 y.o. female with past medical history of HTN, type 2 DM, HLD, COPD, GERD, anxiety and tobacco abuse who presents for f/u of her chronic medical conditions.  GAD: She has been taking Effexor  and as needed Xanax .  She has been stressed due to her grand daughter's being in jail since 04/25 due to her alcohol  abuse related loss of an infant.  Patient complains of anhedonia, insomnia and lack of appetite.  Denies SI or HI currently. She still reports insomnia, and she has to wake up early to take her Grandkid to school.  BP is well-controlled. Takes medications regularly. Patient denies headache, dizziness, chest pain, dyspnea or palpitations.   Type 2 DM: Her HbA1c is 5.7, improved from 7.0 in 04/24.  She has been taking Ozempic  0.5 mg QW, and has been tolerating it well.  She has lost about 6 pounds since the last visit.  Her diarrhea has resolved since stopping metformin .  She denies polyuria and polyphagia.  She complains of recent worsening of low back pain for the last 1 week.  Her pain is intermittent, dull, radiating towards RLE.  Denies any numbness or tingling of the LE.  She reports her back pain being related to overexertion at times.  Her pain usually improves with Tylenol.  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/13/2022   Mixed hyperlipidemia  11/12/2015   Neuromuscular disorder (HCC)    OSA (obstructive sleep apnea) 06/30/2022   Osteoporosis    Stroke (HCC)    Ulcer     Past Surgical History:  Procedure Laterality Date   ABDOMINAL HYSTERECTOMY     COLONOSCOPY N/A 02/19/2017   Procedure: COLONOSCOPY;  Surgeon: Golda Claudis PENNER, MD;  Location: [facility];  Service: Endoscopy;  Laterality: N/A;  830   EYE SURGERY     FRACTURE SURGERY     SHOULDER ARTHROSCOPY WITH OPEN ROTATOR CUFF REPAIR Right 03/25/2022   Procedure: SHOULDER ARTHROSCOPY WITH MINI OPEN ROTATOR CUFF REPAIR;  Surgeon: Margrette Taft BRAVO, MD;  Location: [facility];  Service: Orthopedics;  Laterality: Right;   TUBAL LIGATION      Family History  Problem Relation Age of Onset   Breast cancer Maternal Aunt     Social History   Socioeconomic History   Marital status: Single    Spouse name: Not on file   Number of children: Not on file   Years of education: Not on file   Highest education level: Some college, no degree  Occupational History   Not on file  Tobacco Use   Smoking status: Former    Types: Cigars   Smokeless tobacco: Never  Substance and Sexual Activity   Alcohol  use: Not Currently    Alcohol /week: 1.0 standard drink of alcohol   Types: 1 Cans of beer per week    Comment: some days   Drug use: Not Currently    Types: Marijuana    Comment: here and there for pain   Sexual activity: Not on file  Other Topics Concern   Not on file  Social History Narrative   Not on file   Social Drivers of Health   Financial Resource Strain: Medium Risk (03/04/2023)   Overall Financial Resource Strain (CARDIA)    Difficulty of Paying Living Expenses: Somewhat hard  Food Insecurity: Food Insecurity Present (11/01/2023)   Hunger Vital Sign    Worried About Running Out of Food in the Last Year: Sometimes true    Ran Out of Food in the Last Year: Sometimes true  Transportation Needs: No Transportation Needs (11/01/2023)   PRAPARE - Therapist, art (Medical): No    Lack of Transportation (Non-Medical): No  Physical Activity: Sufficiently Active (11/01/2023)   Exercise Vital Sign    Days of Exercise per Week: 7 days    Minutes of Exercise per Session: 60 min  Stress: Stress Concern Present (11/01/2023)   [employer] of Occupational Health - Occupational Stress Questionnaire    Feeling of Stress: Very much  Social Connections: Moderately Integrated (11/01/2023)   Social Connection and Isolation Panel    Frequency of Communication with Friends and Family: More than three times a week    Frequency of Social Gatherings with Friends and Family: More than three times a week    Attends Religious Services: More than 4 times per year    Active Member of [employer] or Organizations: Yes    Attends Banker Meetings: 1 to 4 times per year    Marital Status: Widowed  Intimate Partner Violence: Not At Risk (10/07/2022)   Humiliation, Afraid, Rape, and Kick questionnaire    Fear of Current or Ex-Partner: No    Emotionally Abused: No    Physically Abused: No    Sexually Abused: No    Outpatient Medications Prior to Visit  Medication Sig Dispense Refill   albuterol  (VENTOLIN  HFA) 108 (90 Base) MCG/ACT inhaler Inhale 2 puffs into the lungs every 6 (six) hours as needed for wheezing or shortness of breath. 8 g 4   ALPRAZolam  (XANAX ) 0.5 MG tablet TAKE 1 TABLET BY MOUTH IN THE MORNING. MAY also TAKE 2 TABLETS AT BEDTIME AS NEEDED FOR ANXIETY 90 tablet 3   amLODipine  (NORVASC ) 10 MG tablet TAKE 1 TABLET BY MOUTH EVERY DAY 90 tablet 3   aspirin EC 81 MG tablet Take 81 mg by mouth daily.     atorvastatin  (LIPITOR) 20 MG tablet TAKE 1 TABLET BY MOUTH DAILY 90 tablet 1   blood glucose meter kit and supplies KIT Dispense based on patient and insurance preference. Use up to four times daily as directed. 1 each 0   BREZTRI  AEROSPHERE 160-9-4.8 MCG/ACT AERO INHALE TWO PUFFS BY MOUTH TWICE DAILY 10.7 g 11   cholecalciferol  (VITAMIN D3) 25 MCG (1000 UNIT) tablet Take 1,000 Units by mouth daily.     esomeprazole  (NEXIUM ) 40 MG capsule TAKE ONE CAPSULE BY MOUTH DAILY 90 capsule 3   fluticasone  (FLONASE ) 50 MCG/ACT nasal spray Place 2 sprays into both nostrils daily. (Patient taking differently: Place 2 sprays into both nostrils daily as needed for allergies.) 16 g 6   losartan  (COZAAR ) 25 MG tablet Take 1 tablet (25 mg total) by mouth daily. 90 tablet 3   montelukast  (SINGULAIR )  10 MG tablet TAKE 1 TABLET BY MOUTH AT BEDTIME 90 tablet 3   polyvinyl alcohol  (LIQUIFILM TEARS) 1.4 % ophthalmic solution Place 1 drop into both eyes as needed for dry eyes.     Semaglutide ,0.25 or 0.5MG /DOS, (OZEMPIC , 0.25 OR 0.5 MG/DOSE,) 2 MG/3ML SOPN INJECT 0.5 MG INTO THE SKIN EVERY 7 (SEVEN) DAYS. 9 mL 1   tacrolimus  (PROTOPIC ) 0.1 % ointment Apply topically 2 (two) times daily. (Patient taking differently: Apply 1 Application topically daily as needed (rash).) 100 g 0   triamcinolone  cream (KENALOG ) 0.1 % Apply 1 Application topically 2 (two) times daily. (Patient taking differently: Apply 1 Application topically 2 (two) times daily as needed (rash).) 30 g 0   venlafaxine  XR (EFFEXOR -XR) 75 MG 24 hr capsule TAKE ONE CAPSULE BY MOUTH DAILY WITH BREAKFAST 90 capsule 1   hydrochlorothiazide  (MICROZIDE ) 12.5 MG capsule TAKE ONE CAPSULE BY MOUTH EVERY DAY 90 capsule 1   metFORMIN  (GLUCOPHAGE -XR) 500 MG 24 hr tablet TAKE 1 TABLET BY MOUTH DAILY WITH BREAKFAST 90 tablet 2   No facility-administered medications prior to visit.    No Known Allergies  ROS Review of Systems  Constitutional:  Negative for chills and fever.  HENT:  Negative for congestion, postnasal drip, sinus pain and sore throat.   Eyes:  Negative for pain and discharge.  Respiratory:  Negative for cough, shortness of breath and wheezing.   Cardiovascular:  Negative for chest pain and palpitations.  Gastrointestinal:  Negative for abdominal pain, diarrhea, nausea and  vomiting.  Endocrine: Negative for polydipsia and polyuria.  Genitourinary:  Negative for dysuria and hematuria.  Musculoskeletal:  Positive for back pain. Negative for neck pain and neck stiffness.  Skin:  Negative for rash.  Neurological:  Negative for dizziness and weakness.  Psychiatric/Behavioral:  Positive for sleep disturbance. Negative for agitation and behavioral problems. The patient is nervous/anxious.       Objective:    Physical Exam Vitals reviewed.  Constitutional:      General: She is not in acute distress.    Appearance: She is not diaphoretic.  HENT:     Head: Normocephalic and atraumatic.     Nose: No congestion.     Mouth/Throat:     Mouth: Mucous membranes are moist.  Eyes:     General: No scleral icterus.    Extraocular Movements: Extraocular movements intact.  Cardiovascular:     Rate and Rhythm: Normal rate and regular rhythm.     Heart sounds: Normal heart sounds. No murmur heard. Pulmonary:     Breath sounds: Normal breath sounds. No wheezing or rales.  Musculoskeletal:     Cervical back: Neck supple. No tenderness.     Lumbar back: Tenderness present. Normal range of motion.     Right lower leg: No edema.     Left lower leg: No edema.  Feet:     Right foot:     Toenail Condition: Right toenails are abnormally thick and ingrown.     Left foot:     Toenail Condition: Left toenails are abnormally thick and ingrown.  Skin:    General: Skin is warm.     Findings: No rash.     Comments: Skin tag over neck region  Neurological:     General: No focal deficit present.     Mental Status: She is alert and oriented to person, place, and time.     Sensory: No sensory deficit.     Motor: No weakness.  Psychiatric:  Mood and Affect: Mood is depressed.        Behavior: Behavior normal.     BP 136/70 (BP Location: Left Arm)   Pulse 74   Ht 4' 11 (1.499 m)   Wt 145 lb (65.8 kg)   SpO2 96%   BMI 29.29 kg/m  Wt Readings from Last 3  Encounters:  11/05/23 145 lb (65.8 kg)  07/06/23 151 lb 9.6 oz (68.8 kg)  03/06/23 156 lb (70.8 kg)    Lab Results  Component Value Date   TSH 3.160 07/03/2023   Lab Results  Component Value Date   WBC 8.1 07/03/2023   HGB 13.8 07/03/2023   HCT 44.1 07/03/2023   MCV 88 07/03/2023   PLT 331 07/03/2023   Lab Results  Component Value Date   NA 141 07/03/2023   K 4.8 07/03/2023   CO2 20 07/03/2023   GLUCOSE 103 (H) 07/03/2023   BUN 23 07/03/2023   CREATININE 0.94 07/03/2023   BILITOT 0.3 07/03/2023   ALKPHOS 54 07/03/2023   AST 25 07/03/2023   ALT 23 07/03/2023   PROT 6.6 07/03/2023   ALBUMIN 4.3 07/03/2023   CALCIUM  9.4 07/03/2023   ANIONGAP 12 03/21/2022   EGFR 63 07/03/2023   Lab Results  Component Value Date   CHOL 192 07/03/2023   Lab Results  Component Value Date   HDL 58 07/03/2023   Lab Results  Component Value Date   LDLCALC 107 (H) 07/03/2023   Lab Results  Component Value Date   TRIG 156 (H) 07/03/2023   Lab Results  Component Value Date   CHOLHDL 3.3 07/03/2023   Lab Results  Component Value Date   HGBA1C 5.9 (H) 07/03/2023      Assessment & Plan:   Problem List Items Addressed This Visit       Cardiovascular and Mediastinum   Essential hypertension - Primary   BP Readings from Last 1 Encounters:  11/05/23 136/70   Well-controlled with amlodipine  10 mg once daily, losartan  25 mg QD and hydrochlorothiazide  12.5 mg QD Counseled for compliance with the medications Advised DASH diet and moderate exercise/walking, at least 150 mins/week      Relevant Medications   hydrochlorothiazide  (MICROZIDE ) 12.5 MG capsule     Respiratory   Chronic obstructive pulmonary disease (COPD) (HCC) (Chronic)   Overall has been well controlled with Breztri  and as needed albuterol         Endocrine   Type 2 diabetes mellitus with other specified complication (HCC)   Lab Results  Component Value Date   HGBA1C 5.9 (H) 07/03/2023   Overall well  controlled, was 7.0 in 04/24 Associated with HTN and HLD On Ozempic  0.5 mg qw now Advised to follow diabetic diet On statin F/u CMP and lipid panel Diabetic eye exam: Advised to follow up with Ophthalmology for diabetic eye exam      Relevant Orders   Bayer DCA Hb A1c Waived     Musculoskeletal and Integument   Degeneration of intervertebral disc of lumbar region with discogenic back pain and lower extremity pain   She reports chronic low back pain, radiating to RLE recently Gets better with Tylenol, advised to take Tylenol as needed Advised to avoid heavy lifting and frequent bending Heating pad and/or back brace as needed If persistent, will obtain imaging        Other   GAD (generalized anxiety disorder) (Chronic)   Usually well-controlled with Effexor  and Xanax  Had persistent insomnia despite taking Xanax  0.5 mg  at bedtime, increased nighttime dose of Xanax  to 1 mg in the last visit - PDMP reviewed PDMP reviewed Checked urine tox assure Advised to avoid any illicit drug including marijuana      Moderate episode of recurrent major depressive disorder (HCC)   Well-controlled with Effexor  to 75 mg daily now Would avoid changing medication for now as her PHQ-9 is skewed due to recent stressors         Meds ordered this encounter  Medications   hydrochlorothiazide  (MICROZIDE ) 12.5 MG capsule    Sig: Take 1 capsule (12.5 mg total) by mouth daily.    Dispense:  90 capsule    Refill:  3    Follow-up: Return in about 4 months (around 03/06/2024) for DM and GAD.    Suzzane MARLA Blanch, MD

## 2023-11-05 NOTE — Assessment & Plan Note (Signed)
 She reports chronic low back pain, radiating to RLE recently Gets better with Tylenol, advised to take Tylenol as needed Advised to avoid heavy lifting and frequent bending Heating pad and/or back brace as needed If persistent, will obtain imaging

## 2023-11-05 NOTE — Patient Instructions (Signed)
 Please take Tylenol as needed for back pain.  Please continue to take medications as prescribed.  Please continue to follow low carb diet and perform moderate exercise/walking as tolerated.

## 2023-11-05 NOTE — Assessment & Plan Note (Signed)
 Usually well-controlled with Effexor  and Xanax  Had persistent insomnia despite taking Xanax  0.5 mg at bedtime, increased nighttime dose of Xanax  to 1 mg in the last visit - PDMP reviewed PDMP reviewed Checked urine tox assure Advised to avoid any illicit drug including marijuana

## 2023-11-05 NOTE — Assessment & Plan Note (Addendum)
 Lab Results  Component Value Date   HGBA1C 5.9 (H) 07/03/2023   Overall well controlled, was 7.0 in 04/24 Associated with HTN and HLD On Ozempic  0.5 mg qw now Advised to follow diabetic diet On statin F/u CMP and lipid panel Diabetic eye exam: Advised to follow up with Ophthalmology for diabetic eye exam

## 2023-11-05 NOTE — Assessment & Plan Note (Signed)
 BP Readings from Last 1 Encounters:  11/05/23 136/70   Well-controlled with amlodipine  10 mg once daily, losartan  25 mg QD and hydrochlorothiazide  12.5 mg QD Counseled for compliance with the medications Advised DASH diet and moderate exercise/walking, at least 150 mins/week

## 2023-11-05 NOTE — Assessment & Plan Note (Signed)
Overall has been well controlled with Breztri and as needed albuterol

## 2023-11-05 NOTE — Assessment & Plan Note (Signed)
 Well-controlled with Effexor  to 75 mg daily now Would avoid changing medication for now as her PHQ-9 is skewed due to recent stressors

## 2023-11-07 LAB — BAYER DCA HB A1C WAIVED: HB A1C (BAYER DCA - WAIVED): 5.7 % — ABNORMAL HIGH (ref 4.8–5.6)

## 2023-11-09 ENCOUNTER — Ambulatory Visit: Payer: Self-pay | Admitting: Internal Medicine

## 2023-11-26 DIAGNOSIS — H35462 Secondary vitreoretinal degeneration, left eye: Secondary | ICD-10-CM | POA: Diagnosis not present

## 2023-11-26 LAB — HM DIABETES EYE EXAM

## 2023-12-09 ENCOUNTER — Ambulatory Visit: Payer: Medicare PPO

## 2023-12-09 VITALS — Ht 59.0 in | Wt 145.0 lb

## 2023-12-09 DIAGNOSIS — Z780 Asymptomatic menopausal state: Secondary | ICD-10-CM

## 2023-12-09 DIAGNOSIS — Z0000 Encounter for general adult medical examination without abnormal findings: Secondary | ICD-10-CM | POA: Diagnosis not present

## 2023-12-09 NOTE — Patient Instructions (Signed)
 Robin Bridges,  Thank you for taking the time for your Medicare Wellness Visit. I appreciate your continued commitment to your health goals. Please review the care plan we discussed, and feel free to reach out if I can assist you further.  Medicare recommends these wellness visits once per year to help you and your care team stay ahead of potential health issues. These visits are designed to focus on prevention, allowing your provider to concentrate on managing your acute and chronic conditions during your regular appointments.  Please note that Annual Wellness Visits do not include a physical exam. Some assessments may be limited, especially if the visit was conducted virtually. If needed, we may recommend a separate in-person follow-up with your provider.  Ongoing Care  Seeing your primary care provider every 3 to 6 months helps us  monitor your health and provide consistent, personalized care.  Referrals Bone Density Screening: Call [facility] @ Phone: [phone] If you haven't heard from the office in 2 weeks, please call them to schedule your appointment  Recommended Screenings:  Health Maintenance  Topic Date Due   DEXA scan (bone density measurement)  12/04/2022   Flu Shot  10/09/2023   COVID-19 Vaccine (4 - 2025-26 season) 11/09/2023   Colon Cancer Screening  02/20/2024   Yearly kidney health urinalysis for diabetes  03/05/2024   Complete foot exam   03/23/2024   Hemoglobin A1C  05/07/2024   Yearly kidney function blood test for diabetes  07/02/2024   Breast Cancer Screening  07/21/2024   Eye exam for diabetics  11/25/2024   Medicare Annual Wellness Visit  12/08/2024   DTaP/Tdap/Td vaccine (2 - Td or Tdap) 08/11/2032   Pneumococcal Vaccine for age over 43  Completed   Hepatitis C Screening  Completed   Zoster (Shingles) Vaccine  Completed   HPV Vaccine  Aged Out   Meningitis B Vaccine  Aged Out       10/07/2022   10:13 AM  Advanced Directives  Does Patient  Have a Medical Advance Directive? No  Would patient like information on creating a medical advance directive? No - Patient declined    Advance Care Planning is important because it: Ensures you receive medical care that aligns with your values, goals, and preferences. Provides guidance to your family and loved ones, reducing the emotional burden of decision-making during critical moments.  Vision: Annual vision screenings are recommended for early detection of glaucoma, cataracts, and diabetic retinopathy. These exams can also reveal signs of chronic conditions such as diabetes and high blood pressure.  Dental: Annual dental screenings help detect early signs of oral cancer, gum disease, and other conditions linked to overall health, including heart disease and diabetes.  Please see the attached documents for additional preventive care recommendations.

## 2023-12-09 NOTE — Progress Notes (Signed)
 Subjective:   Robin Bridges is a 78 y.o. who presents for a Medicare Wellness preventive visit.  As a reminder, Annual Wellness Visits don't include a physical exam, and some assessments may be limited, especially if this visit is performed virtually. We may recommend an in-person follow-up visit with your provider if needed.  Visit Complete: Virtual I connected with  Dagoberto KANDICE Lesches on 12/09/23 by a audio enabled telemedicine application and verified that I am speaking with the correct person using two identifiers.  Patient Location: Home  Provider Location: Home Office  I discussed the limitations of evaluation and management by telemedicine. The patient expressed understanding and agreed to proceed.  Vital Signs: Because this visit was a virtual/telehealth visit, some criteria may be missing or patient reported. Any vitals not documented were not able to be obtained and vitals that have been documented are patient reported.  VideoDeclined- This patient declined Librarian, academic. Therefore the visit was completed with audio only.  Persons Participating in Visit: Patient.  AWV Questionnaire: Yes: Patient Medicare AWV questionnaire was completed by the patient on 12/07/2023; I have confirmed that all information answered by patient is correct and no changes since this date.  Cardiac Risk Factors include: advanced age (>71men, >55 women);diabetes mellitus;dyslipidemia;hypertension;sedentary lifestyle     Objective:    Today's Vitals   12/09/23 0846  Weight: 145 lb (65.8 kg)  Height: 4' 11 (1.499 m)   Body mass index is 29.29 kg/m.     10/07/2022   10:13 AM 03/25/2022   11:22 AM 03/21/2022    1:32 PM 10/04/2021   10:11 AM 10/02/2020   10:19 AM 02/19/2017    7:29 AM  Advanced Directives  Does Patient Have a Medical Advance Directive? No No No No No No   Would patient like information on creating a medical advance directive? No - Patient declined No  - Patient declined No - Patient declined No - Patient declined No - Patient declined No - Patient declined      Data saved with a previous flowsheet row definition    Current Medications (verified) Outpatient Encounter Medications as of 12/09/2023  Medication Sig   albuterol  (VENTOLIN  HFA) 108 (90 Base) MCG/ACT inhaler Inhale 2 puffs into the lungs every 6 (six) hours as needed for wheezing or shortness of breath.   ALPRAZolam  (XANAX ) 0.5 MG tablet TAKE 1 TABLET BY MOUTH IN THE MORNING. MAY also TAKE 2 TABLETS AT BEDTIME AS NEEDED FOR ANXIETY   amLODipine  (NORVASC ) 10 MG tablet TAKE 1 TABLET BY MOUTH EVERY DAY   aspirin EC 81 MG tablet Take 81 mg by mouth daily.   atorvastatin  (LIPITOR) 20 MG tablet TAKE 1 TABLET BY MOUTH DAILY   blood glucose meter kit and supplies KIT Dispense based on patient and insurance preference. Use up to four times daily as directed.   BREZTRI  AEROSPHERE 160-9-4.8 MCG/ACT AERO INHALE TWO PUFFS BY MOUTH TWICE DAILY   cholecalciferol (VITAMIN D3) 25 MCG (1000 UNIT) tablet Take 1,000 Units by mouth daily.   esomeprazole  (NEXIUM ) 40 MG capsule TAKE ONE CAPSULE BY MOUTH DAILY   fluticasone  (FLONASE ) 50 MCG/ACT nasal spray Place 2 sprays into both nostrils daily. (Patient taking differently: Place 2 sprays into both nostrils daily as needed for allergies.)   hydrochlorothiazide  (MICROZIDE ) 12.5 MG capsule Take 1 capsule (12.5 mg total) by mouth daily.   losartan  (COZAAR ) 25 MG tablet Take 1 tablet (25 mg total) by mouth daily.   montelukast  (SINGULAIR ) 10  MG tablet TAKE 1 TABLET BY MOUTH AT BEDTIME   polyvinyl alcohol  (LIQUIFILM TEARS) 1.4 % ophthalmic solution Place 1 drop into both eyes as needed for dry eyes.   Semaglutide ,0.25 or 0.5MG /DOS, (OZEMPIC , 0.25 OR 0.5 MG/DOSE,) 2 MG/3ML SOPN INJECT 0.5 MG INTO THE SKIN EVERY 7 (SEVEN) DAYS.   tacrolimus  (PROTOPIC ) 0.1 % ointment Apply topically 2 (two) times daily. (Patient taking differently: Apply 1 Application topically  daily as needed (rash).)   triamcinolone  cream (KENALOG ) 0.1 % Apply 1 Application topically 2 (two) times daily. (Patient taking differently: Apply 1 Application topically 2 (two) times daily as needed (rash).)   venlafaxine  XR (EFFEXOR -XR) 75 MG 24 hr capsule TAKE ONE CAPSULE BY MOUTH DAILY WITH BREAKFAST   No facility-administered encounter medications on file as of 12/09/2023.    Allergies (verified) Patient has no known allergies.   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   Mixed hyperlipidemia 11/12/2015   Neuromuscular disorder (HCC)    OSA (obstructive sleep apnea) 06/30/2022   Osteoporosis    Stroke (HCC)    Ulcer    Past Surgical History:  Procedure Laterality Date   ABDOMINAL HYSTERECTOMY     COLONOSCOPY N/A 02/19/2017   Procedure: COLONOSCOPY;  Surgeon: Golda Claudis PENNER, MD;  Location: [facility];  Service: Endoscopy;  Laterality: N/A;  830   EYE SURGERY     FRACTURE SURGERY     SHOULDER ARTHROSCOPY WITH OPEN ROTATOR CUFF REPAIR Right 03/25/2022   Procedure: SHOULDER ARTHROSCOPY WITH MINI OPEN ROTATOR CUFF REPAIR;  Surgeon: Margrette Taft BRAVO, MD;  Location: [facility];  Service: Orthopedics;  Laterality: Right;   TUBAL LIGATION     Family History  Problem Relation Age of Onset   Breast cancer Maternal Aunt    Social History   Socioeconomic History   Marital status: Single    Spouse name: Not on file   Number of children: Not on file   Years of education: Not on file   Highest education level: Some college, no degree  Occupational History   Not on file  Tobacco Use   Smoking status: Former     Types: Cigars   Smokeless tobacco: Never  Substance and Sexual Activity   Alcohol  use: Not Currently    Alcohol /week: 1.0 standard drink of alcohol     Types: 1 Cans of beer per week    Comment: some days   Drug use: Not Currently    Types: Marijuana    Comment: here and there for pain   Sexual activity: Not Currently    Birth control/protection: Abstinence  Other Topics Concern   Not on file  Social History Narrative   Not on file   Social Drivers of Health   Financial Resource Strain: Medium Risk (12/09/2023)   Overall Financial Resource Strain (CARDIA)    Difficulty of Paying Living Expenses: Somewhat hard  Food Insecurity: No Food Insecurity (12/09/2023)   Hunger Vital Sign    Worried About Running Out of Food in the Last Year: Never true    Ran Out of Food in the Last Year: Never true  Recent Concern:  Food Insecurity - Food Insecurity Present (11/01/2023)   Hunger Vital Sign    Worried About Running Out of Food in the Last Year: Sometimes true    Ran Out of Food in the Last Year: Sometimes true  Transportation Needs: No Transportation Needs (12/09/2023)   PRAPARE - Administrator, Civil Service (Medical): No    Lack of Transportation (Non-Medical): No  Physical Activity: Sufficiently Active (12/09/2023)   Exercise Vital Sign    Days of Exercise per Week: 7 days    Minutes of Exercise per Session: 30 min  Stress: No Stress Concern Present (12/09/2023)   [employer] of Occupational Health - Occupational Stress Questionnaire    Feeling of Stress: Not at all  Recent Concern: Stress - Stress Concern Present (11/01/2023)   [employer] of Occupational Health - Occupational Stress Questionnaire    Feeling of Stress: Very much  Social Connections: Moderately Integrated (12/09/2023)   Social Connection and Isolation Panel    Frequency of Communication with Friends and Family: More than three times a week    Frequency of Social Gatherings with Friends and  Family: More than three times a week    Attends Religious Services: More than 4 times per year    Active Member of [employer] or Organizations: Yes    Attends Banker Meetings: More than 4 times per year    Marital Status: Widowed    Tobacco Counseling Counseling given: Yes    Clinical Intake:  Pre-visit preparation completed: Yes  Pain : No/denies pain     BMI - recorded: 29.29 Nutritional Status: BMI 25 -29 Overweight Nutritional Risks: None Diabetes: Yes CBG done?: No (telehealth visit.) Did pt. bring in CBG monitor from home?: No  Lab Results  Component Value Date   HGBA1C 5.7 (H) 11/05/2023   HGBA1C 5.9 (H) 07/03/2023   HGBA1C 6.2 (H) 03/06/2023     How often do you need to have someone help you when you read instructions, pamphlets, or other written materials from your doctor or pharmacy?: 1 - Never  Interpreter Needed?: No  Information entered by :: Kaliya Shreiner W CMA (AAMA)   Activities of Daily Living     12/07/2023    1:50 AM  In your present state of health, do you have any difficulty performing the following activities:  Hearing? 0  Vision? 0  Difficulty concentrating or making decisions? 0  Walking or climbing stairs? 0  Dressing or bathing? 0  Doing errands, shopping? 0  Preparing Food and eating ? N  Using the Toilet? N  In the past six months, have you accidently leaked urine? N  Do you have problems with loss of bowel control? N  Managing your Medications? N  Managing your Finances? N  Housekeeping or managing your Housekeeping? N    Patient Care Team: Tobie Suzzane POUR, MD as PCP - General (Internal Medicine) Dr [facility], OD  I have updated your Care Teams any recent Medical Services you may have received from other providers in the past year.     Assessment:   This is a routine wellness examination for Elinora.  Hearing/Vision screen Hearing Screening - Comments:: Patient denies any hearing difficulties.    Vision Screening - Comments:: Wears rx glasses - up to date with routine eye exams with  Willma Moats   Goals Addressed   None     Depression Screen     12/09/2023    9:15 AM 11/05/2023  1:11 PM 07/06/2023    8:39 AM 03/06/2023    8:42 AM 11/03/2022    8:55 AM 10/07/2022   10:14 AM 10/07/2022   10:13 AM  PHQ 2/9 Scores  PHQ - 2 Score 0 2 6 0 0 1 1  PHQ- 9 Score 0 5 24         Fall Risk     12/07/2023    1:50 AM 07/06/2023    8:39 AM 03/06/2023    8:42 AM 11/03/2022    8:55 AM 10/07/2022   10:13 AM  Fall Risk   Falls in the past year? 0 0 0 0 0  Number falls in past yr: 0 0 0 0 0  Injury with Fall? 0 0 0 0 0  Risk for fall due to : No Fall Risks No Fall Risks No Fall Risks  No Fall Risks  Follow up Falls evaluation completed;Education provided;Falls prevention discussed Falls evaluation completed Falls evaluation completed  Falls evaluation completed    MEDICARE RISK AT HOME:  Medicare Risk at Home Any stairs in or around the home?: (Patient-Rptd) No If so, are there any without handrails?: (Patient-Rptd) No Home free of loose throw rugs in walkways, pet beds, electrical cords, etc?: (Patient-Rptd) Yes Adequate lighting in your home to reduce risk of falls?: (Patient-Rptd) Yes Life alert?: (Patient-Rptd) No Use of a cane, walker or w/c?: (Patient-Rptd) No Grab bars in the bathroom?: (Patient-Rptd) Yes Shower chair or bench in shower?: (Patient-Rptd) Yes Elevated toilet seat or a handicapped toilet?: (Patient-Rptd) Yes  TIMED UP AND GO:  Was the test performed?  No  Cognitive Function: 6CIT completed    10/02/2020   10:20 AM  MMSE - Mini Mental State Exam  Not completed: Unable to complete      11/03/2022    9:48 AM  Montreal Cognitive Assessment   Visuospatial/ Executive (0/5) 4  Naming (0/3) 3  Attention: Read list of digits (0/2) 2  Attention: Read list of letters (0/1) 1  Attention: Serial 7 subtraction starting at 100 (0/3) 3  Language: Repeat  phrase (0/2) 2  Language : Fluency (0/1) 1  Abstraction (0/2) 2  Delayed Recall (0/5) 3  Orientation (0/6) 6  Total 27      12/09/2023    9:12 AM 10/07/2022   10:14 AM 10/04/2021   10:12 AM 10/02/2020   10:20 AM  6CIT Screen  What Year? 0 points 0 points 0 points 0 points  What month? 0 points 0 points 0 points 0 points  What time? 0 points 0 points 0 points 0 points  Count back from 20 0 points 0 points 0 points 0 points  Months in reverse 0 points 0 points 0 points 0 points  Repeat phrase 0 points 0 points 0 points 0 points  Total Score 0 points 0 points 0 points 0 points    Immunizations Immunization History  Administered Date(s) Administered   Fluad Quad(high Dose 65+) 12/05/2021   Fluad Trivalent(High Dose 65+) 11/03/2022   Influenza-Unspecified 11/08/2013, 01/22/2021   Moderna Covid-19 Vaccine Bivalent Booster 40yrs & up 12/05/2021   Moderna SARS-COV2 Booster Vaccination 02/14/2020, 01/22/2021   Moderna Sars-Covid-2 Vaccination 05/10/2019, 06/07/2019   PNEUMOCOCCAL CONJUGATE-20 09/12/2021   Tdap 08/12/2022   Zoster Recombinant(Shingrix) 07/30/2020, 08/12/2022    Screening Tests Health Maintenance  Topic Date Due   DEXA SCAN  12/04/2022   Influenza Vaccine  10/09/2023   COVID-19 Vaccine (4 - 2025-26 season) 11/09/2023   Colonoscopy  02/20/2024  Diabetic kidney evaluation - Urine ACR  03/05/2024   FOOT EXAM  03/23/2024   HEMOGLOBIN A1C  05/07/2024   Diabetic kidney evaluation - eGFR measurement  07/02/2024   Mammogram  07/21/2024   OPHTHALMOLOGY EXAM  11/25/2024   Medicare Annual Wellness (AWV)  12/08/2024   DTaP/Tdap/Td (2 - Td or Tdap) 08/11/2032   Pneumococcal Vaccine: 50+ Years  Completed   Hepatitis C Screening  Completed   Zoster Vaccines- Shingrix  Completed   HPV VACCINES  Aged Out   Meningococcal B Vaccine  Aged Out    Health Maintenance Health Maintenance Due  Topic Date Due   DEXA SCAN  12/04/2022   Influenza Vaccine  10/09/2023   COVID-19  Vaccine (4 - 2025-26 season) 11/09/2023   Colonoscopy  02/20/2024   Health Maintenance Items Addressed: DEXA ordered  Additional Screening:  Vision Screening: Recommended annual ophthalmology exams for early detection of glaucoma and other disorders of the eye. Would you like a referral to an eye doctor? No    Dental Screening: Recommended annual dental exams for proper oral hygiene  Community Resource Referral / Chronic Care Management: CRR required this visit?  No   CCM required this visit?  No   Plan:    I have personally reviewed and noted the following in the patient's chart:   Medical and social history Use of alcohol , tobacco or illicit drugs  Current medications and supplements including opioid prescriptions. Patient is not currently taking opioid prescriptions. Functional ability and status Nutritional status Physical activity Advanced directives List of other physicians Hospitalizations, surgeries, and ER visits in previous 12 months Vitals Screenings to include cognitive, depression, and falls Referrals and appointments  In addition, I have reviewed and discussed with patient certain preventive protocols, quality metrics, and best practice recommendations. A written personalized care plan for preventive services as well as general preventive health recommendations were provided to patient.   Shaterra Sanzone, CMA   12/09/2023   After Visit Summary: (MyChart) Due to this being a telephonic visit, the after visit summary with patients personalized plan was offered to patient via MyChart   Notes: Please refer to Routing Comments.

## 2023-12-10 ENCOUNTER — Encounter: Payer: Self-pay | Admitting: Internal Medicine

## 2023-12-10 ENCOUNTER — Other Ambulatory Visit: Payer: Self-pay | Admitting: Internal Medicine

## 2023-12-10 ENCOUNTER — Telehealth: Payer: Self-pay | Admitting: Internal Medicine

## 2023-12-10 ENCOUNTER — Ambulatory Visit (INDEPENDENT_AMBULATORY_CARE_PROVIDER_SITE_OTHER): Admitting: Internal Medicine

## 2023-12-10 VITALS — BP 119/83 | HR 92 | Ht 59.0 in | Wt 144.2 lb

## 2023-12-10 DIAGNOSIS — R059 Cough, unspecified: Secondary | ICD-10-CM

## 2023-12-10 DIAGNOSIS — F411 Generalized anxiety disorder: Secondary | ICD-10-CM

## 2023-12-10 DIAGNOSIS — J209 Acute bronchitis, unspecified: Secondary | ICD-10-CM | POA: Diagnosis not present

## 2023-12-10 MED ORDER — AZITHROMYCIN 250 MG PO TABS: ORAL_TABLET | ORAL | 0 refills | Status: AC

## 2023-12-10 MED ORDER — METHYLPREDNISOLONE 4 MG PO TBPK: ORAL_TABLET | ORAL | 0 refills | Status: DC

## 2023-12-10 NOTE — Patient Instructions (Addendum)
 Please schedule Bone Density.  Please start taking azithromycin  as prescribed.  Please start taking prednisone as prescribed.  Please take Robitussin as needed for cough.  Please maintain at least 64 ounces of fluid intake in a day.

## 2023-12-10 NOTE — Assessment & Plan Note (Signed)
 Started empiric azithromycin  for acute sinusitis Started Medrol  dosepak Continue Mucinex or Robitussin PRN for cough Breztri  and as needed albuterol  for asthmatic bronchitis Advised to contact if persistent or worsening symptoms

## 2023-12-10 NOTE — Progress Notes (Signed)
 Acute Office Visit  Subjective:    Patient ID: Robin Bridges, female    DOB: [birth year], 78 y.o.   MRN: [id number]  Chief Complaint  Patient presents with   Cough    Ongoing since yesterday. Feeling under the weather, has chills. Has phlegm with cough.     HPI Patient is in today for complaint of cough with yellowish expectoration, nasal congestion, sinus pressure related facial pain, sore throat and mild dyspnea since yesterday.  She has tried taking Robitussin with mild relief.  She went to watch the game at the stadium with her grand daughter, and attributes her symptoms to cold weather.  She has a history of asthmatic bronchitis.  She uses Breztri  as maintenance inhaler and albuterol  as needed for dyspnea or wheezing.  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   Mixed hyperlipidemia 11/12/2015   Neuromuscular disorder (HCC)    OSA (obstructive sleep apnea) 06/30/2022   Osteoporosis    Stroke [facility])    Ulcer     Past Surgical History:  Procedure Laterality Date   ABDOMINAL HYSTERECTOMY     COLONOSCOPY N/A 02/19/2017   Procedure: COLONOSCOPY;  Surgeon: Golda Claudis PENNER, MD;  Location: [facility] ENDO SUITE;  Service: Endoscopy;  Laterality: N/A;  830   EYE SURGERY     FRACTURE SURGERY     SHOULDER ARTHROSCOPY WITH OPEN ROTATOR CUFF REPAIR Right 03/25/2022   Procedure: SHOULDER ARTHROSCOPY WITH MINI OPEN ROTATOR CUFF REPAIR;  Surgeon: Margrette Taft BRAVO, MD;  Location: [facility] ORS;  Service: Orthopedics;  Laterality: Right;   TUBAL LIGATION      Family History  Problem Relation Age of Onset   Breast cancer Maternal Aunt      Social History   Socioeconomic History   Marital status: Single    Spouse name: Not on file   Number of children: Not on file   Years of education: Not on file   Highest education level: Some college, no degree  Occupational History   Not on file  Tobacco Use   Smoking status: Former    Types: Cigars   Smokeless tobacco: Never  Substance and Sexual Activity   Alcohol  use: Not Currently    Alcohol /week: 1.0 standard drink of alcohol     Types: 1 Cans of beer per week    Comment: some days   Drug use: Not Currently    Types: Marijuana    Comment: here and there for pain   Sexual activity: Not Currently    Birth control/protection: Abstinence  Other Topics Concern   Not on file  Social History Narrative   Not on file   Social Drivers of Health   Financial Resource Strain: Medium Risk (12/09/2023)   Overall Financial Resource Strain (CARDIA)    Difficulty of Paying Living Expenses: Somewhat hard  Food Insecurity: No Food Insecurity (12/09/2023)   Hunger Vital Sign    Worried About Running Out of Food in the Last Year: Never true    Ran Out of Food in the Last Year: Never true  Recent Concern: Food Insecurity - Food Insecurity Present (11/01/2023)   Hunger  Vital Sign    Worried About Programme researcher, broadcasting/film/video in the Last Year: Sometimes true    Ran Out of Food in the Last Year: Sometimes true  Transportation Needs: No Transportation Needs (12/09/2023)   PRAPARE - Administrator, Civil Service (Medical): No    Lack of Transportation (Non-Medical): No  Physical Activity: Sufficiently Active (12/09/2023)   Exercise Vital Sign    Days of Exercise per Week: 7 days    Minutes of Exercise per Session: 30 min  Stress: No Stress Concern Present (12/09/2023)   [employer] of Occupational Health - Occupational Stress Questionnaire    Feeling of Stress: Not at all  Recent Concern: Stress - Stress Concern Present (11/01/2023)   [employer] of Occupational Health -  Occupational Stress Questionnaire    Feeling of Stress: Very much  Social Connections: Moderately Integrated (12/09/2023)   Social Connection and Isolation Panel    Frequency of Communication with Friends and Family: More than three times a week    Frequency of Social Gatherings with Friends and Family: More than three times a week    Attends Religious Services: More than 4 times per year    Active Member of [employer] or Organizations: Yes    Attends Banker Meetings: More than 4 times per year    Marital Status: Widowed  Intimate Partner Violence: Not At Risk (12/09/2023)   Humiliation, Afraid, Rape, and Kick questionnaire    Fear of Current or Ex-Partner: No    Emotionally Abused: No    Physically Abused: No    Sexually Abused: No    Outpatient Medications Prior to Visit  Medication Sig Dispense Refill   albuterol  (VENTOLIN  HFA) 108 (90 Base) MCG/ACT inhaler Inhale 2 puffs into the lungs every 6 (six) hours as needed for wheezing or shortness of breath. 8 g 4   ALPRAZolam  (XANAX ) 0.5 MG tablet TAKE 1 TABLET BY MOUTH IN THE MORNING. MAY also TAKE 2 TABLETS AT BEDTIME AS NEEDED FOR ANXIETY 90 tablet 3   amLODipine  (NORVASC ) 10 MG tablet TAKE 1 TABLET BY MOUTH EVERY DAY 90 tablet 3   aspirin EC 81 MG tablet Take 81 mg by mouth daily.     atorvastatin  (LIPITOR) 20 MG tablet TAKE 1 TABLET BY MOUTH DAILY 90 tablet 1   blood glucose meter kit and supplies KIT Dispense based on patient and insurance preference. Use up to four times daily as directed. 1 each 0   BREZTRI  AEROSPHERE 160-9-4.8 MCG/ACT AERO INHALE TWO PUFFS BY MOUTH TWICE DAILY 10.7 g 11   cholecalciferol (VITAMIN D3) 25 MCG (1000 UNIT) tablet Take 1,000 Units by mouth daily.     esomeprazole  (NEXIUM ) 40 MG capsule TAKE ONE CAPSULE BY MOUTH DAILY 90 capsule 3   fluticasone  (FLONASE ) 50 MCG/ACT nasal spray Place 2 sprays into both nostrils daily. (Patient taking differently: Place 2 sprays into both nostrils daily as needed  for allergies.) 16 g 6   hydrochlorothiazide  (MICROZIDE ) 12.5 MG capsule Take 1 capsule (12.5 mg total) by mouth daily. 90 capsule 3   losartan  (COZAAR ) 25 MG tablet Take 1 tablet (25 mg total) by mouth daily. 90 tablet 3   montelukast  (SINGULAIR ) 10 MG tablet TAKE 1 TABLET BY MOUTH AT BEDTIME 90 tablet 3   polyvinyl alcohol  (LIQUIFILM TEARS) 1.4 % ophthalmic solution Place 1 drop into both eyes as needed for dry eyes.     Semaglutide ,0.25 or 0.5MG /DOS, (OZEMPIC , 0.25 OR 0.5 MG/DOSE,) 2 MG/3ML SOPN INJECT  0.5 MG INTO THE SKIN EVERY 7 (SEVEN) DAYS. 9 mL 1   tacrolimus  (PROTOPIC ) 0.1 % ointment Apply topically 2 (two) times daily. (Patient taking differently: Apply 1 Application topically daily as needed (rash).) 100 g 0   triamcinolone  cream (KENALOG ) 0.1 % Apply 1 Application topically 2 (two) times daily. (Patient taking differently: Apply 1 Application topically 2 (two) times daily as needed (rash).) 30 g 0   venlafaxine  XR (EFFEXOR -XR) 75 MG 24 hr capsule TAKE ONE CAPSULE BY MOUTH DAILY WITH BREAKFAST 90 capsule 1   No facility-administered medications prior to visit.    No Known Allergies  Review of Systems  Constitutional:  Negative for chills and fever.  HENT:  Positive for congestion, postnasal drip, sinus pressure and sore throat.   Eyes:  Negative for pain and discharge.  Respiratory:  Positive for cough and shortness of breath.   Cardiovascular:  Negative for chest pain and palpitations.  Gastrointestinal:  Negative for abdominal pain, diarrhea, nausea and vomiting.  Endocrine: Negative for polydipsia and polyuria.  Genitourinary:  Negative for dysuria and hematuria.  Musculoskeletal:  Positive for back pain. Negative for neck pain and neck stiffness.  Skin:  Negative for rash.  Neurological:  Negative for dizziness and weakness.  Psychiatric/Behavioral:  Positive for sleep disturbance. Negative for agitation and behavioral problems. The patient is nervous/anxious.         Objective:    Physical Exam Vitals reviewed.  Constitutional:      General: She is not in acute distress.    Appearance: She is not diaphoretic.  HENT:     Head: Normocephalic and atraumatic.     Nose: Congestion present.     Mouth/Throat:     Mouth: Mucous membranes are moist.  Eyes:     General: No scleral icterus.    Extraocular Movements: Extraocular movements intact.  Cardiovascular:     Rate and Rhythm: Normal rate and regular rhythm.     Heart sounds: Normal heart sounds. No murmur heard. Pulmonary:     Breath sounds: Normal breath sounds. No wheezing or rales.  Musculoskeletal:     Cervical back: Neck supple. No tenderness.     Lumbar back: Tenderness present. Normal range of motion.     Right lower leg: No edema.     Left lower leg: No edema.  Skin:    General: Skin is warm.     Findings: No rash.     Comments: Skin tag over neck region  Neurological:     General: No focal deficit present.     Mental Status: She is alert and oriented to person, place, and time.     Sensory: No sensory deficit.     Motor: No weakness.  Psychiatric:        Mood and Affect: Mood normal.        Behavior: Behavior normal.     BP 119/83   Pulse 92   Ht 4' 11 (1.499 m)   Wt 144 lb 3.2 oz (65.4 kg)   SpO2 95%   BMI 29.12 kg/m  Wt Readings from Last 3 Encounters:  12/10/23 144 lb 3.2 oz (65.4 kg)  12/09/23 145 lb (65.8 kg)  11/05/23 145 lb (65.8 kg)        Assessment & Plan:   Problem List Items Addressed This Visit       Respiratory   Acute bronchitis - Primary   Started empiric azithromycin  for acute sinusitis Started Medrol  dosepak Continue Mucinex or Robitussin  PRN for cough Breztri  and as needed albuterol  for asthmatic bronchitis Advised to contact if persistent or worsening symptoms      Relevant Medications   methylPREDNISolone  (MEDROL  DOSEPAK) 4 MG TBPK tablet   azithromycin  (ZITHROMAX ) 250 MG tablet   Other Visit Diagnoses       Cough, unspecified  type       Relevant Orders   Veritor Flu A/B Waived        Meds ordered this encounter  Medications   methylPREDNISolone  (MEDROL  DOSEPAK) 4 MG TBPK tablet    Sig: Take as package instructions.    Dispense:  1 each    Refill:  0   azithromycin  (ZITHROMAX ) 250 MG tablet    Sig: Take 2 tablets on day 1, then 1 tablet daily on days 2 through 5    Dispense:  6 tablet    Refill:  0     Brannon Decaire MARLA Blanch, MD

## 2023-12-10 NOTE — Telephone Encounter (Signed)
 FYI, Called patient voicemail full, sent a mychart message for her to call office to schedule the bone density.

## 2023-12-12 LAB — VERITOR FLU A/B WAIVED
Influenza A: NEGATIVE
Influenza B: NEGATIVE

## 2023-12-21 ENCOUNTER — Ambulatory Visit: Admitting: Internal Medicine

## 2024-01-12 ENCOUNTER — Encounter: Payer: Self-pay | Admitting: Internal Medicine

## 2024-01-12 ENCOUNTER — Ambulatory Visit: Admitting: Internal Medicine

## 2024-01-12 VITALS — BP 156/79 | HR 94 | Ht 59.0 in | Wt 149.0 lb

## 2024-01-12 DIAGNOSIS — Z87891 Personal history of nicotine dependence: Secondary | ICD-10-CM | POA: Diagnosis not present

## 2024-01-12 DIAGNOSIS — J4489 Other specified chronic obstructive pulmonary disease: Secondary | ICD-10-CM | POA: Diagnosis not present

## 2024-01-12 DIAGNOSIS — J441 Chronic obstructive pulmonary disease with (acute) exacerbation: Secondary | ICD-10-CM

## 2024-01-12 MED ORDER — ALBUTEROL SULFATE HFA 108 (90 BASE) MCG/ACT IN AERS
2.0000 | INHALATION_SPRAY | Freq: Four times a day (QID) | RESPIRATORY_TRACT | 4 refills | Status: AC | PRN
Start: 2024-01-12 — End: ?

## 2024-01-12 MED ORDER — FLUTICASONE FUROATE-VILANTEROL 100-25 MCG/ACT IN AEPB: INHALATION_SPRAY | RESPIRATORY_TRACT | 11 refills | Status: AC

## 2024-01-12 MED ORDER — TRELEGY ELLIPTA 100-62.5-25 MCG/ACT IN AEPB: 1.0000 | INHALATION_SPRAY | Freq: Every day | RESPIRATORY_TRACT | Status: AC

## 2024-01-12 NOTE — Progress Notes (Unsigned)
 Robin Bridges, female    DOB: 1945/09/01  MRN: [id number]   Brief patient profile:  29 yobf  quit smoking Aug 2022 with onset of cough > sob at death of husband Aug 15, 2018 referred to pulmonary clinic in [place]  06/27/2021 by Dr Tobie for   ? Copd with 20 lb wt gain since stopped with completely reversible airflow obst 06/2021 PFT    History of Present Illness  06/27/2021  Pulmonary/ 1st office eval/ Robin Bridges / [facility] Bridges [facility]  Chief Complaint  Patient presents with   Consult    Ref by Dr. Tobie for ongoing bronchitis/cough/sob  Patient believes she could have asthma. She states the pollen and strong perfume smells set off her coughing and SOB   Dyspnea:  walk  better p rx  breztri  / still  doe vacuuming / shopping is  ok  Cough: typically p supper  but not really taking breztri  2 q 12 and seems better p pm breztri  when remembers to take it / sputum mucoid  Sleep: ok hs  SABA use: twice weekly Rec Plan A = Automatic = Always=  continue  Breztri  Take 2 puffs first thing in am and then another 2 puffs about 12 hours later.   Work on inhaler technique:   -  remember how a golfer takes practice swings  Plan B = Backup (to supplement plan A, not to replace it) Only use your albuterol  inhaler as a rescue medication   - PFT's  07/01/21   FEV1 1.44 (83 % ) ratio 0.81  p 21 % improvement from saba p breztri  prior to study with DLCO  Nl  And FV curve concave and ERV 16%  at wt 155     10/07/2021  f/u ov/[place] office/Robin Bridges re: AB maint on breztri  2bid   Chief Complaint  Patient presents with   Follow-up    Breathing is about the same since last ov. Heat and humidity bothering patient.   Dyspnea:  vacuuming still req albuterol  to get thru Cough: none  Sleeping:  flat bed/ 3 pillows  SABA use: maybe once a day, none at hs  02: none  Covid status: vax x 4  Lung cancer screening: CT chest  05/14/21  Rec Work on inhaler technique:   Only use your albuterol  as a rescue medication  Ok to  try albuterol  15 min before an activity (on alternating days)  that you know would usually make you short of breath      10/14/2022  yearly  f/u ov/[place] office/Robin Bridges re: AB maint on breztri  2bid   Chief Complaint  Patient presents with   Asthmatic bronchitis, chronic   Dyspnea:  improved  vaccuuming easier/ some track walking 30 min  Cough: at hs better p albuterol  no noct saba  Sleeping: flat bed/ 2pillows on head  SABA use: at hs and in heat  02: none  Fleeing cp when stretches arms ((ext rotation) shoots from L cw to R side like a cramp x sev min never with cough or exertion Lung cancer screening: referred today  Rec Take your singulair  and pepcid 20 mg about an hour before bed should help the night time cough  Work on inhaler technique:     Consider bed blocks 6-8 inches  My office will be contacting you by phone for referral to lung cancer screening program > not done as of 01/12/2024  >  did not meet criteria        01/12/2024  f/u ov/[place] office/Robin Bridges re: AB maint on Breztri   needs LCS ***  Chief Complaint  Patient presents with   Asthma    Shob acting up - 1 wk ago using maint. Inhaler doesn't have rescue inhaler    Dyspnea:  does track at school x 30 min  Cough: none  Sleeping: flat bed/ 2 pillows s   resp cc  SABA use: not using  02: not using       No obvious day to day or daytime variability or assoc excess/ purulent sputum or mucus plugs or hemoptysis or cp or chest tightness, subjective wheeze or overt sinus or hb symptoms.    Also denies any obvious fluctuation of symptoms with weather or environmental changes or other aggravating or alleviating factors except as outlined above   No unusual exposure hx or h/o childhood pna/ asthma or knowledge of premature birth.  Current Allergies, Complete Past Medical History, Past Surgical History, Family History, and Social History were reviewed in [employer] record.  ROS  The following  are not active complaints unless bolded Hoarseness, sore throat, dysphagia, dental problems, itching, sneezing,  nasal congestion or discharge of excess mucus or purulent secretions, ear ache,   fever, chills, sweats, unintended wt loss or wt gain, classically pleuritic or exertional cp,  orthopnea pnd or arm/hand swelling  or leg swelling, presyncope, palpitations, abdominal pain, anorexia, nausea, vomiting, diarrhea  or change in bowel habits or change in bladder habits, change in stools or change in urine, dysuria, hematuria,  rash, arthralgias, visual complaints, headache, numbness, weakness or ataxia or problems with walking or coordination,  change in mood or  memory.        Current Meds  Medication Sig   albuterol  (VENTOLIN  HFA) 108 (90 Base) MCG/ACT inhaler Inhale 2 puffs into the lungs every 6 (six) hours as needed for wheezing or shortness of breath.   ALPRAZolam  (XANAX ) 0.5 MG tablet TAKE 1 TABLET BY MOUTH IN THE MORNING. MAY also TAKE 2 TABLETS AT BEDTIME AS NEEDED FOR ANXIETY   amLODipine  (NORVASC ) 10 MG tablet TAKE 1 TABLET BY MOUTH EVERY DAY   aspirin EC 81 MG tablet Take 81 mg by mouth daily.   atorvastatin  (LIPITOR) 20 MG tablet TAKE 1 TABLET BY MOUTH DAILY   blood glucose meter kit and supplies KIT Dispense based on patient and insurance preference. Use up to four times daily as directed.   BREZTRI  AEROSPHERE 160-9-4.8 MCG/ACT AERO INHALE TWO PUFFS BY MOUTH TWICE DAILY   cholecalciferol (VITAMIN D3) 25 MCG (1000 UNIT) tablet Take 1,000 Units by mouth daily.   esomeprazole  (NEXIUM ) 40 MG capsule TAKE ONE CAPSULE BY MOUTH DAILY   fluticasone  (FLONASE ) 50 MCG/ACT nasal spray Place 2 sprays into both nostrils daily. (Patient taking differently: Place 2 sprays into both nostrils daily as needed for allergies.)   hydrochlorothiazide  (MICROZIDE ) 12.5 MG capsule Take 1 capsule (12.5 mg total) by mouth daily.   losartan  (COZAAR ) 25 MG tablet Take 1 tablet (25 mg total) by mouth daily.    methylPREDNISolone  (MEDROL  DOSEPAK) 4 MG TBPK tablet Take as package instructions.   montelukast  (SINGULAIR ) 10 MG tablet TAKE 1 TABLET BY MOUTH AT BEDTIME   polyvinyl alcohol  (LIQUIFILM TEARS) 1.4 % ophthalmic solution Place 1 drop into both eyes as needed for dry eyes.   Semaglutide ,0.25 or 0.5MG /DOS, (OZEMPIC , 0.25 OR 0.5 MG/DOSE,) 2 MG/3ML SOPN INJECT 0.5 MG INTO THE SKIN EVERY 7 (SEVEN) DAYS.   tacrolimus  (PROTOPIC ) 0.1 % ointment Apply topically  2 (two) times daily. (Patient taking differently: Apply 1 Application topically daily as needed (rash).)   triamcinolone  cream (KENALOG ) 0.1 % Apply 1 Application topically 2 (two) times daily. (Patient taking differently: Apply 1 Application topically 2 (two) times daily as needed (rash).)   venlafaxine  XR (EFFEXOR -XR) 75 MG 24 hr capsule TAKE ONE CAPSULE BY MOUTH DAILY WITH BREAKFAST             Past Medical History:  Diagnosis Date   Hypertension         Objective:    wts  01/12/2024        95  10/14/2022         164   10/07/21 158 lb 9.6 oz (71.9 kg)  09/12/21 157 lb 9.6 oz (71.5 kg)  08/06/21 154 lb (69.9 kg)     Vital signs reviewed  01/12/2024  - Note at rest 02 sats  ***% on ***   General appearance:    ***              Assessment

## 2024-01-12 NOTE — Patient Instructions (Signed)
 Plan A = Automatic = Always=    BREO one click each am and at two good deep forceful drags then rinse mouth before your dental care   Plan B = Backup (to supplement plan A, not to replace it) Use your albuterol  inhaler as a rescue medication to be used if you can't catch your breath by resting or slowing your pace  or doing a relaxed purse lip breathing pattern.  - The less you use it, the better it will work when you need it. - Ok to use the inhaler up to 2 puffs  every 4 hours if you must but call for appointment if use goes up over your usual need - Don't leave home without it !!  (think of it like the spare tire or starter fluid for your car)     Also  Ok to try albuterol  15 min before an activity (on alternating days)  that you know would usually make you short of breath and see if it makes any difference and if makes none then don't take albuterol  after activity unless you can't catch your breath as this means it's the resting that helps, not the albuterol .  Please schedule a follow up visit in 6 months but call sooner if needed - be sure to bring inhalers

## 2024-01-13 ENCOUNTER — Ambulatory Visit: Admitting: Podiatry

## 2024-01-13 ENCOUNTER — Encounter: Payer: Self-pay | Admitting: Podiatry

## 2024-01-13 DIAGNOSIS — M79675 Pain in left toe(s): Secondary | ICD-10-CM

## 2024-01-13 DIAGNOSIS — E1169 Type 2 diabetes mellitus with other specified complication: Secondary | ICD-10-CM | POA: Diagnosis not present

## 2024-01-13 DIAGNOSIS — M79674 Pain in right toe(s): Secondary | ICD-10-CM | POA: Diagnosis not present

## 2024-01-13 DIAGNOSIS — B351 Tinea unguium: Secondary | ICD-10-CM

## 2024-01-13 DIAGNOSIS — L84 Corns and callosities: Secondary | ICD-10-CM

## 2024-01-13 NOTE — Assessment & Plan Note (Addendum)
 Onset around 2020 p husband's death with 20 lb wt gain  - Stopped smoking 10/2020 at wt 151  - 06/27/2021  After extensive coaching inhaler device,  effectiveness =    50% at best > continue breztri  pending pfts  - 06/27/2021   Walked on RA  x  3  lap(s) =  approx 450  ft  @ fast pace, stopped due to end of study, onset  sob on 2nd lap  with lowest 02 sats 95% - PFT's  07/01/21   FEV1 1.44 (83 % ) ratio 0.81  p 21 % improvement from saba p breztri  prior to study with DLCO  Nl  And FV curve concave and ERV 16%  at wt 155    10/14/2022  After extensive coaching inhaler device,  effectiveness =    75% (short Ti)   - 01/12/2024  After extensive coaching inhaler device,  effectiveness =    90% with DPI and close to 0 on hfa  Can't use hfa effectively so Continue BREO 100 1 clicak each am and if needs saba should use proair  respiclick instead of hfa   Discussed in detail all the  indications, usual  risks and alternatives  relative to the benefits with patient who agrees to proceed with Rx as outlined.            Each maintenance medication was reviewed in detail including emphasizing most importantly the difference between maintenance and prns and under what circumstances the prns are to be triggered using an action plan format where appropriate.  Total time for H and P, chart review, counseling, reviewing hfa/ dpi  device(s) and generating customized AVS unique to this office visit / same day charting = 23 min

## 2024-01-13 NOTE — Progress Notes (Signed)
This patient returns to my office for at risk foot care.  This patient requires this care by a professional since this patient will be at risk due to having type 2 diabetes. This patient is unable to cut nails herself since the patient cannot reach her nails.These nails are painful walking and wearing shoes.  This patient presents for at risk foot care today.  General Appearance  Alert, conversant and in no acute stress.  Vascular  Dorsalis pedis and posterior tibial  pulses are palpable  bilaterally.  Capillary return is within normal limits  bilaterally. Temperature is within normal limits  bilaterally.  Neurologic  Senn-Weinstein monofilament wire test within normal limits  bilaterally. Muscle power within normal limits bilaterally.  Nails Thick disfigured discolored nails with subungual debris  from hallux to fifth toes bilaterally. No evidence of bacterial infection or drainage bilaterally.  Orthopedic  No limitations of motion  feet .  No crepitus or effusions noted.  No bony pathology or digital deformities noted.  Skin  normotropic skin with no porokeratosis noted bilaterally.  No signs of infections or ulcers noted.     Onychomycosis  Pain in right toes  Pain in left toes  Consent was obtained for treatment procedures.   Mechanical debridement of nails 1-5  bilaterally performed with a nail nipper.  Filed with dremel without incident.    Return office visit    3 months                  Told patient to return for periodic foot care and evaluation due to potential at risk complications.   Shaunta Oncale DPM   

## 2024-01-22 ENCOUNTER — Encounter (INDEPENDENT_AMBULATORY_CARE_PROVIDER_SITE_OTHER): Payer: Self-pay | Admitting: *Deleted

## 2024-01-26 ENCOUNTER — Other Ambulatory Visit: Payer: Self-pay | Admitting: Internal Medicine

## 2024-01-26 DIAGNOSIS — E1169 Type 2 diabetes mellitus with other specified complication: Secondary | ICD-10-CM

## 2024-02-22 ENCOUNTER — Ambulatory Visit: Payer: Self-pay | Admitting: Internal Medicine

## 2024-02-22 ENCOUNTER — Ambulatory Visit (HOSPITAL_COMMUNITY)
Admission: RE | Admit: 2024-02-22 | Discharge: 2024-02-22 | Disposition: A | Source: Ambulatory Visit | Attending: Internal Medicine

## 2024-02-22 DIAGNOSIS — Z780 Asymptomatic menopausal state: Secondary | ICD-10-CM | POA: Insufficient documentation

## 2024-02-22 NOTE — Telephone Encounter (Signed)
 Pt advised of verbal understanding pt was wondering if to continue the 1,000 or do 2,000 vitamin D 

## 2024-02-29 NOTE — Progress Notes (Signed)
 Pharmacy Quality Measure Review  This patient is appearing on a report for being at risk of failing the adherence measure for diabetes medications this calendar year.   Medication: Ozempic  0.25/0.5 mg Last fill date: 01/26/24 for 84 day supply  Insurance report was not up to date. No action needed at this time.   Jenkins Graces, PharmD PGY1 Pharmacy Resident

## 2024-03-09 ENCOUNTER — Encounter: Payer: Self-pay | Admitting: Internal Medicine

## 2024-03-09 ENCOUNTER — Ambulatory Visit: Admitting: Internal Medicine

## 2024-03-09 VITALS — BP 134/80 | HR 89 | Ht 59.0 in | Wt 148.8 lb

## 2024-03-09 DIAGNOSIS — Z7985 Long-term (current) use of injectable non-insulin antidiabetic drugs: Secondary | ICD-10-CM

## 2024-03-09 DIAGNOSIS — E1169 Type 2 diabetes mellitus with other specified complication: Secondary | ICD-10-CM | POA: Diagnosis not present

## 2024-03-09 DIAGNOSIS — F411 Generalized anxiety disorder: Secondary | ICD-10-CM | POA: Diagnosis not present

## 2024-03-09 DIAGNOSIS — J449 Chronic obstructive pulmonary disease, unspecified: Secondary | ICD-10-CM | POA: Diagnosis not present

## 2024-03-09 DIAGNOSIS — E782 Mixed hyperlipidemia: Secondary | ICD-10-CM

## 2024-03-09 DIAGNOSIS — I10 Essential (primary) hypertension: Secondary | ICD-10-CM

## 2024-03-09 MED ORDER — VENLAFAXINE HCL ER 75 MG PO CP24: 75.0000 mg | ORAL_CAPSULE | Freq: Every day | ORAL | 1 refills | Status: AC

## 2024-03-09 NOTE — Assessment & Plan Note (Signed)
 Lab Results  Component Value Date   HGBA1C 5.7 (H) 11/05/2023   Overall well controlled, was 7.0 in 04/24 Associated with HTN and HLD On Ozempic  0.5 mg qw now - would keep same dose for now considering her age, response to the medicine and GI side effects Advised to follow diabetic diet On statin F/u CMP and lipid panel Diabetic eye exam: Advised to follow up with Ophthalmology for diabetic eye exam

## 2024-03-09 NOTE — Assessment & Plan Note (Addendum)
 Overall has been well controlled with Breo and as needed albuterol  Was given Trelegy sample recently by Pulmonology

## 2024-03-09 NOTE — Assessment & Plan Note (Signed)
 Usually well-controlled with Effexor  and Xanax  Takes Xanax  0.5 mg QAM and 1 mg qHS - PDMP reviewed Checked urine tox assure Advised to avoid any illicit drug including marijuana

## 2024-03-09 NOTE — Patient Instructions (Signed)
Please start taking Vitamin D 2000 IU once daily.  Please continue to take medications as prescribed.  Please continue to follow low carb diet and perform moderate exercise/walking at least 150 mins/week.

## 2024-03-09 NOTE — Progress Notes (Signed)
 "  Established Patient Office Visit  Subjective:  Patient ID: Robin Bridges, female    DOB: [birth year]  Age: 78 y.o. MRN: [id number]  CC:  Chief Complaint  Patient presents with   Diabetes    4 month f/u    Anxiety    4 month f/u     HPI Robin Bridges is a 78 y.o. female with past medical history of HTN, type 2 DM, HLD, COPD, GERD, anxiety and tobacco abuse who presents for f/u of her chronic medical conditions.  BP is well-controlled. Takes medications regularly. Patient denies headache, dizziness, chest pain, dyspnea or palpitations.   Type 2 DM: Her HbA1c was 5.7 in 08/25, improved from 7.0 in 04/24.  She has been taking Ozempic  0.5 mg QW, and has been tolerating it well.  Her diarrhea has resolved since stopping metformin .  She denies polyuria and polyphagia.  GAD: She has been taking Effexor  and as needed Xanax . Feels better now. Denies SI or HI currently. She still reports insomnia, and she has to wake up early to take her Grandkid to school.  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   Mixed hyperlipidemia 11/12/2015   Neuromuscular disorder (HCC)    OSA (obstructive sleep apnea) 06/30/2022   Osteoporosis    Stroke [facility])    Ulcer     Past Surgical History:  Procedure Laterality Date   ABDOMINAL HYSTERECTOMY     COLONOSCOPY N/A 02/19/2017   Procedure: COLONOSCOPY;  Surgeon: Golda Claudis PENNER, MD;  Location: [facility]ITE;  Service: Endoscopy;  Laterality: N/A;  830   EYE SURGERY     FRACTURE SURGERY     SHOULDER ARTHROSCOPY WITH OPEN ROTATOR CUFF REPAIR Right 03/25/2022   Procedure: SHOULDER ARTHROSCOPY WITH MINI  OPEN ROTATOR CUFF REPAIR;  Surgeon: Margrette Taft BRAVO, MD;  Location: [facility] ORS;  Service: Orthopedics;  Laterality: Right;   TUBAL LIGATION      Family History  Problem Relation Age of Onset   Breast cancer Maternal Aunt     Social History   Socioeconomic History   Marital status: Single    Spouse name: Not on file   Number of children: Not on file   Years of education: Not on file   Highest education level: Some college, no degree  Occupational History   Not on file  Tobacco Use   Smoking status: Former    Types: Cigars   Smokeless tobacco: Never  Substance and Sexual Activity   Alcohol  use: Not Currently    Alcohol /week: 1.0 standard drink of alcohol     Types: 1 Cans of beer per week    Comment: some days   Drug use: Not Currently    Types: Marijuana    Comment: here and there for pain   Sexual activity: Not Currently    Birth control/protection: Abstinence  Other Topics Concern   Not on file  Social History Narrative   Not on file   Social Drivers of Health   Tobacco Use: Medium Risk (03/09/2024)   Patient History    Smoking Tobacco Use: Former    Smokeless Tobacco Use:  Never    Passive Exposure: Not on file  Financial Resource Strain: Medium Risk (03/05/2024)   Overall Financial Resource Strain (CARDIA)    Difficulty of Paying Living Expenses: Somewhat hard  Food Insecurity: Food Insecurity Present (03/05/2024)   Epic    Worried About Programme Researcher, Broadcasting/film/video in the Last Year: Sometimes true    Ran Out of Food in the Last Year: Never true  Transportation Needs: No Transportation Needs (03/05/2024)   Epic    Lack of Transportation (Medical): No    Lack of Transportation (Non-Medical): No  Physical Activity: Sufficiently Active (03/05/2024)   Exercise Vital Sign    Days of Exercise per Week: 7 days    Minutes of Exercise per Session: 60 min  Stress: Stress Concern Present (03/05/2024)   [employer] of Occupational Health - Occupational Stress  Questionnaire    Feeling of Stress: Very much  Social Connections: Moderately Integrated (03/05/2024)   Social Connection and Isolation Panel    Frequency of Communication with Friends and Family: More than three times a week    Frequency of Social Gatherings with Friends and Family: Three times a week    Attends Religious Services: More than 4 times per year    Active Member of Clubs or Organizations: Yes    Attends Banker Meetings: More than 4 times per year    Marital Status: Widowed  Intimate Partner Violence: Not At Risk (12/09/2023)   Epic    Fear of Current or Ex-Partner: No    Emotionally Abused: No    Physically Abused: No    Sexually Abused: No  Depression (PHQ2-9): Low Risk (03/09/2024)   Depression (PHQ2-9)    PHQ-2 Score: 0  Alcohol  Screen: Low Risk (03/05/2024)   Alcohol  Screen    Last Alcohol  Screening Score (AUDIT): 3  Housing: Low Risk (03/05/2024)   Epic    Unable to Pay for Housing in the Last Year: No    Number of Times Moved in the Last Year: 0    Homeless in the Last Year: No  Utilities: Not At Risk (12/09/2023)   Epic    Threatened with loss of utilities: No  Health Literacy: Adequate Health Literacy (12/09/2023)   B1300 Health Literacy    Frequency of need for help with medical instructions: Never    Outpatient Medications Prior to Visit  Medication Sig Dispense Refill   albuterol  (VENTOLIN  HFA) 108 (90 Base) MCG/ACT inhaler Inhale 2 puffs into the lungs every 6 (six) hours as needed for wheezing or shortness of breath. 8 g 4   ALPRAZolam  (XANAX ) 0.5 MG tablet TAKE 1 TABLET BY MOUTH IN THE MORNING. MAY also TAKE 2 TABLETS AT BEDTIME AS NEEDED FOR ANXIETY 90 tablet 3   amLODipine  (NORVASC ) 10 MG tablet TAKE 1 TABLET BY MOUTH EVERY DAY 90 tablet 3   aspirin EC 81 MG tablet Take 81 mg by mouth daily.     atorvastatin  (LIPITOR) 20 MG tablet TAKE 1 TABLET BY MOUTH DAILY 90 tablet 1   blood glucose meter kit and supplies KIT Dispense based on  patient and insurance preference. Use up to four times daily as directed. 1 each 0   cholecalciferol (VITAMIN D3) 25 MCG (1000 UNIT) tablet Take 2,000 Units by mouth daily.     esomeprazole  (NEXIUM ) 40 MG capsule TAKE ONE CAPSULE BY MOUTH DAILY 90 capsule 3   fluticasone  (FLONASE ) 50 MCG/ACT nasal spray Place 2 sprays into both nostrils daily. (Patient taking differently: Place 2  sprays into both nostrils daily as needed for allergies.) 16 g 6   fluticasone  furoate-vilanterol (BREO ELLIPTA ) 100-25 MCG/ACT AEPB One click each am 28 each 11   Fluticasone -Umeclidin-Vilant (TRELEGY ELLIPTA ) 100-62.5-25 MCG/ACT AEPB Inhale 1 puff into the lungs daily.     hydrochlorothiazide  (MICROZIDE ) 12.5 MG capsule Take 1 capsule (12.5 mg total) by mouth daily. 90 capsule 3   losartan  (COZAAR ) 25 MG tablet Take 1 tablet (25 mg total) by mouth daily. 90 tablet 3   montelukast  (SINGULAIR ) 10 MG tablet TAKE 1 TABLET BY MOUTH AT BEDTIME 90 tablet 3   polyvinyl alcohol  (LIQUIFILM TEARS) 1.4 % ophthalmic solution Place 1 drop into both eyes as needed for dry eyes.     Semaglutide ,0.25 or 0.5MG /DOS, (OZEMPIC , 0.25 OR 0.5 MG/DOSE,) 2 MG/3ML SOPN INJECT 0.5 MG INTO THE SKIN EVERY 7 (SEVEN) DAYS. 9 mL 1   tacrolimus  (PROTOPIC ) 0.1 % ointment Apply topically 2 (two) times daily. (Patient taking differently: Apply 1 Application topically daily as needed (rash).) 100 g 0   triamcinolone  cream (KENALOG ) 0.1 % Apply 1 Application topically 2 (two) times daily. (Patient taking differently: Apply 1 Application topically 2 (two) times daily as needed (rash).) 30 g 0   methylPREDNISolone  (MEDROL  DOSEPAK) 4 MG TBPK tablet Take as package instructions. 1 each 0   venlafaxine  XR (EFFEXOR -XR) 75 MG 24 hr capsule TAKE ONE CAPSULE BY MOUTH DAILY WITH BREAKFAST 90 capsule 1   No facility-administered medications prior to visit.    No Known Allergies  ROS Review of Systems  Constitutional:  Negative for chills and fever.  HENT:   Negative for congestion, postnasal drip, sinus pain and sore throat.   Eyes:  Negative for pain and discharge.  Respiratory:  Negative for cough, shortness of breath and wheezing.   Cardiovascular:  Negative for chest pain and palpitations.  Gastrointestinal:  Negative for abdominal pain, diarrhea, nausea and vomiting.  Endocrine: Negative for polydipsia and polyuria.  Genitourinary:  Negative for dysuria and hematuria.  Musculoskeletal:  Positive for back pain. Negative for neck pain and neck stiffness.  Skin:  Negative for rash.  Neurological:  Negative for dizziness and weakness.  Psychiatric/Behavioral:  Positive for sleep disturbance. Negative for agitation and behavioral problems. The patient is nervous/anxious.       Objective:    Physical Exam Vitals reviewed.  Constitutional:      General: She is not in acute distress.    Appearance: She is not diaphoretic.  HENT:     Head: Normocephalic and atraumatic.     Nose: No congestion.     Mouth/Throat:     Mouth: Mucous membranes are moist.  Eyes:     General: No scleral icterus.    Extraocular Movements: Extraocular movements intact.  Cardiovascular:     Rate and Rhythm: Normal rate and regular rhythm.     Heart sounds: Normal heart sounds. No murmur heard. Pulmonary:     Breath sounds: Normal breath sounds. No wheezing or rales.  Musculoskeletal:     Cervical back: Neck supple. No tenderness.     Lumbar back: Tenderness present. Normal range of motion.     Right lower leg: No edema.     Left lower leg: No edema.  Feet:     Right foot:     Toenail Condition: Right toenails are abnormally thick and ingrown.     Left foot:     Toenail Condition: Left toenails are abnormally thick and ingrown.  Skin:    General:  Skin is warm.     Findings: No rash.     Comments: Skin tag over neck region  Neurological:     General: No focal deficit present.     Mental Status: She is alert and oriented to person, place, and time.      Sensory: No sensory deficit.     Motor: No weakness.  Psychiatric:        Mood and Affect: Mood normal.        Behavior: Behavior is cooperative.     BP 134/80 (BP Location: Left Arm)   Pulse 89   Ht 4' 11 (1.499 m)   Wt 148 lb 12.8 oz (67.5 kg)   SpO2 96%   BMI 30.05 kg/m  Wt Readings from Last 3 Encounters:  03/09/24 148 lb 12.8 oz (67.5 kg)  01/12/24 149 lb (67.6 kg)  12/10/23 144 lb 3.2 oz (65.4 kg)    Lab Results  Component Value Date   TSH 3.160 07/03/2023   Lab Results  Component Value Date   WBC 8.1 07/03/2023   HGB 13.8 07/03/2023   HCT 44.1 07/03/2023   MCV 88 07/03/2023   PLT 331 07/03/2023   Lab Results  Component Value Date   NA 141 07/03/2023   K 4.8 07/03/2023   CO2 20 07/03/2023   GLUCOSE 103 (H) 07/03/2023   BUN 23 07/03/2023   CREATININE 0.94 07/03/2023   BILITOT 0.3 07/03/2023   ALKPHOS 54 07/03/2023   AST 25 07/03/2023   ALT 23 07/03/2023   PROT 6.6 07/03/2023   ALBUMIN 4.3 07/03/2023   CALCIUM  9.4 07/03/2023   ANIONGAP 12 03/21/2022   EGFR 63 07/03/2023   Lab Results  Component Value Date   CHOL 192 07/03/2023   Lab Results  Component Value Date   HDL 58 07/03/2023   Lab Results  Component Value Date   LDLCALC 107 (H) 07/03/2023   Lab Results  Component Value Date   TRIG 156 (H) 07/03/2023   Lab Results  Component Value Date   CHOLHDL 3.3 07/03/2023   Lab Results  Component Value Date   HGBA1C 5.7 (H) 11/05/2023      Assessment & Plan:   Problem List Items Addressed This Visit       Cardiovascular and Mediastinum   Essential hypertension - Primary   BP Readings from Last 1 Encounters:  03/09/24 134/80   Well-controlled with amlodipine  10 mg once daily, losartan  25 mg QD and hydrochlorothiazide  12.5 mg QD Counseled for compliance with the medications Advised DASH diet and moderate exercise/walking, at least 150 mins/week      Relevant Orders   CBC with Differential/Platelet   CMP14+EGFR      Respiratory   Chronic obstructive pulmonary disease (COPD) (HCC) (Chronic)   Overall has been well controlled with Breo and as needed albuterol  Was given Trelegy sample recently by Pulmonology      Relevant Orders   CBC with Differential/Platelet   CMP14+EGFR     Endocrine   Type 2 diabetes mellitus with other specified complication (HCC)   Lab Results  Component Value Date   HGBA1C 5.7 (H) 11/05/2023   Overall well controlled, was 7.0 in 04/24 Associated with HTN and HLD On Ozempic  0.5 mg qw now - would keep same dose for now considering her age, response to the medicine and GI side effects Advised to follow diabetic diet On statin F/u CMP and lipid panel Diabetic eye exam: Advised to follow up with Ophthalmology for diabetic  eye exam      Relevant Orders   CMP14+EGFR   Hemoglobin A1c   Urine Microalbumin w/creat. ratio     Other   GAD (generalized anxiety disorder) (Chronic)   Usually well-controlled with Effexor  and Xanax  Takes Xanax  0.5 mg QAM and 1 mg qHS - PDMP reviewed Checked urine tox assure Advised to avoid any illicit drug including marijuana      Relevant Medications   venlafaxine  XR (EFFEXOR -XR) 75 MG 24 hr capsule   Mixed hyperlipidemia   Lipid profile reviewed On Atorvastatin  20 mg once daily, needs to improve diet      Relevant Orders   Lipid Profile       Meds ordered this encounter  Medications   venlafaxine  XR (EFFEXOR -XR) 75 MG 24 hr capsule    Sig: Take 1 capsule (75 mg total) by mouth daily with breakfast.    Dispense:  90 capsule    Refill:  1    Follow-up: Return in about 4 months (around 07/07/2024) for DM and GAD (after 07/06/24).    Suzzane MARLA Blanch, MD "

## 2024-03-09 NOTE — Assessment & Plan Note (Signed)
 Lipid profile reviewed On Atorvastatin  20 mg once daily, needs to improve diet

## 2024-03-09 NOTE — Assessment & Plan Note (Signed)
 BP Readings from Last 1 Encounters:  03/09/24 134/80   Well-controlled with amlodipine  10 mg once daily, losartan  25 mg QD and hydrochlorothiazide  12.5 mg QD Counseled for compliance with the medications Advised DASH diet and moderate exercise/walking, at least 150 mins/week

## 2024-03-10 ENCOUNTER — Other Ambulatory Visit: Payer: Self-pay | Admitting: Internal Medicine

## 2024-03-10 DIAGNOSIS — E782 Mixed hyperlipidemia: Secondary | ICD-10-CM

## 2024-03-10 LAB — CBC WITH DIFFERENTIAL/PLATELET
Basophils Absolute: 0 x10E3/uL (ref 0.0–0.2)
Basos: 0 %
EOS (ABSOLUTE): 0.1 x10E3/uL (ref 0.0–0.4)
Eos: 2 %
Hematocrit: 44.5 % (ref 34.0–46.6)
Hemoglobin: 14.5 g/dL (ref 11.1–15.9)
Immature Grans (Abs): 0 x10E3/uL (ref 0.0–0.1)
Immature Granulocytes: 0 %
Lymphocytes Absolute: 2 x10E3/uL (ref 0.7–3.1)
Lymphs: 29 %
MCH: 29.2 pg (ref 26.6–33.0)
MCHC: 32.6 g/dL (ref 31.5–35.7)
MCV: 90 fL (ref 79–97)
Monocytes Absolute: 0.6 x10E3/uL (ref 0.1–0.9)
Monocytes: 8 %
Neutrophils Absolute: 4.1 x10E3/uL (ref 1.4–7.0)
Neutrophils: 61 %
Platelets: 308 x10E3/uL (ref 150–450)
RBC: 4.96 x10E6/uL (ref 3.77–5.28)
RDW: 14.1 % (ref 11.7–15.4)
WBC: 6.7 x10E3/uL (ref 3.4–10.8)

## 2024-03-10 LAB — CMP14+EGFR
ALT: 31 IU/L (ref 0–32)
AST: 35 IU/L (ref 0–40)
Albumin: 4.6 g/dL (ref 3.8–4.8)
Alkaline Phosphatase: 39 IU/L — ABNORMAL LOW (ref 49–135)
BUN/Creatinine Ratio: 13 (ref 12–28)
BUN: 12 mg/dL (ref 8–27)
Bilirubin Total: 0.6 mg/dL (ref 0.0–1.2)
CO2: 21 mmol/L (ref 20–29)
Calcium: 9.4 mg/dL (ref 8.7–10.3)
Chloride: 102 mmol/L (ref 96–106)
Creatinine, Ser: 0.9 mg/dL (ref 0.57–1.00)
Globulin, Total: 2.4 g/dL (ref 1.5–4.5)
Glucose: 110 mg/dL — ABNORMAL HIGH (ref 70–99)
Potassium: 4 mmol/L (ref 3.5–5.2)
Sodium: 139 mmol/L (ref 134–144)
Total Protein: 7 g/dL (ref 6.0–8.5)
eGFR: 66 mL/min/1.73

## 2024-03-10 LAB — HEMOGLOBIN A1C
Est. average glucose Bld gHb Est-mCnc: 120 mg/dL
Hgb A1c MFr Bld: 5.8 % — ABNORMAL HIGH (ref 4.8–5.6)

## 2024-03-10 LAB — LIPID PANEL
Chol/HDL Ratio: 2.9 ratio (ref 0.0–4.4)
Cholesterol, Total: 224 mg/dL — ABNORMAL HIGH (ref 100–199)
HDL: 78 mg/dL
LDL Chol Calc (NIH): 118 mg/dL — ABNORMAL HIGH (ref 0–99)
Triglycerides: 160 mg/dL — ABNORMAL HIGH (ref 0–149)
VLDL Cholesterol Cal: 28 mg/dL (ref 5–40)

## 2024-03-11 ENCOUNTER — Ambulatory Visit: Payer: Self-pay | Admitting: Internal Medicine

## 2024-03-11 LAB — MICROALBUMIN / CREATININE URINE RATIO
Creatinine, Urine: 89.1 mg/dL
Microalb/Creat Ratio: 7 mg/g{creat} (ref 0–29)
Microalbumin, Urine: 6.1 ug/mL

## 2024-03-24 NOTE — Progress Notes (Signed)
 AVAYA MCJUNKINS                                          MRN: [id number]   03/24/2024   The [facility] Quality Team Specialist reviewed this patient medical record for the purposes of chart review for care gap closure. The following were reviewed: abstraction for care gap closure-kidney health evaluation for diabetes:eGFR  and uACR.    [facility] Quality Team

## 2024-04-07 ENCOUNTER — Other Ambulatory Visit: Payer: Self-pay | Admitting: Internal Medicine

## 2024-04-07 ENCOUNTER — Telehealth: Payer: Self-pay

## 2024-04-07 DIAGNOSIS — F411 Generalized anxiety disorder: Secondary | ICD-10-CM

## 2024-04-07 MED ORDER — ALPRAZOLAM 0.5 MG PO TABS: ORAL_TABLET | ORAL | 0 refills | Status: AC

## 2024-04-07 NOTE — Addendum Note (Signed)
 Addended by: ANTONETTA ROLLENE BRAVO on: 04/07/2024 08:41 AM   Modules accepted: Orders

## 2024-04-07 NOTE — Telephone Encounter (Signed)
 Pals lt her know not due till Feb 7 for pickup but script for 1 month has been sent in and will be dispensed only when due

## 2024-04-07 NOTE — Telephone Encounter (Signed)
 Pt needing refill of xanax  to [facility]

## 2024-04-13 ENCOUNTER — Ambulatory Visit: Admitting: Podiatry

## 2024-04-27 ENCOUNTER — Ambulatory Visit: Admitting: Podiatry

## 2024-07-08 ENCOUNTER — Ambulatory Visit: Payer: Self-pay | Admitting: Internal Medicine

## 2024-12-12 ENCOUNTER — Ambulatory Visit
